# Patient Record
Sex: Female | Born: 1990 | Race: White | Hispanic: No | Smoking: Never smoker
Health system: Southern US, Community
[De-identification: ages and names within clinical notes are randomized; demographics above are authoritative.]

## PROBLEM LIST (undated history)

## (undated) DIAGNOSIS — E282 Polycystic ovarian syndrome: Secondary | ICD-10-CM

## (undated) DIAGNOSIS — L509 Urticaria, unspecified: Secondary | ICD-10-CM

## (undated) DIAGNOSIS — K589 Irritable bowel syndrome without diarrhea: Secondary | ICD-10-CM

## (undated) DIAGNOSIS — E063 Autoimmune thyroiditis: Secondary | ICD-10-CM

## (undated) DIAGNOSIS — R0689 Other abnormalities of breathing: Secondary | ICD-10-CM

## (undated) DIAGNOSIS — K219 Gastro-esophageal reflux disease without esophagitis: Secondary | ICD-10-CM

## (undated) DIAGNOSIS — D693 Immune thrombocytopenic purpura: Secondary | ICD-10-CM

## (undated) DIAGNOSIS — F419 Anxiety disorder, unspecified: Secondary | ICD-10-CM

## (undated) HISTORY — DX: Other abnormalities of breathing: R06.89

## (undated) HISTORY — DX: Irritable bowel syndrome, unspecified: K58.9

## (undated) HISTORY — DX: Gastro-esophageal reflux disease without esophagitis: K21.9

## (undated) HISTORY — PX: TONSILLECTOMY: SUR1361

## (undated) HISTORY — DX: Morbid (severe) obesity due to excess calories: E66.01

## (undated) HISTORY — PX: TONSILLECTOMY AND ADENOIDECTOMY: SHX28

## (undated) HISTORY — DX: Urticaria, unspecified: L50.9

## (undated) HISTORY — PX: ADENOIDECTOMY: SUR15

## (undated) HISTORY — DX: Polycystic ovarian syndrome: E28.2

---
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
## 2011-01-08 IMAGING — CR DG KNEE COMPLETE 4+V*R*
5 series · 5 of 5 positions shown · non-contrast
Comparison: None

CLINICAL DATA: Right knee pain and knot, no recent injury, history
ITP and juvenile rheumatoid arthritis

RIGHT KNEE - COMPLETE 4+ VIEW

[view not recorded (1 of 5)]
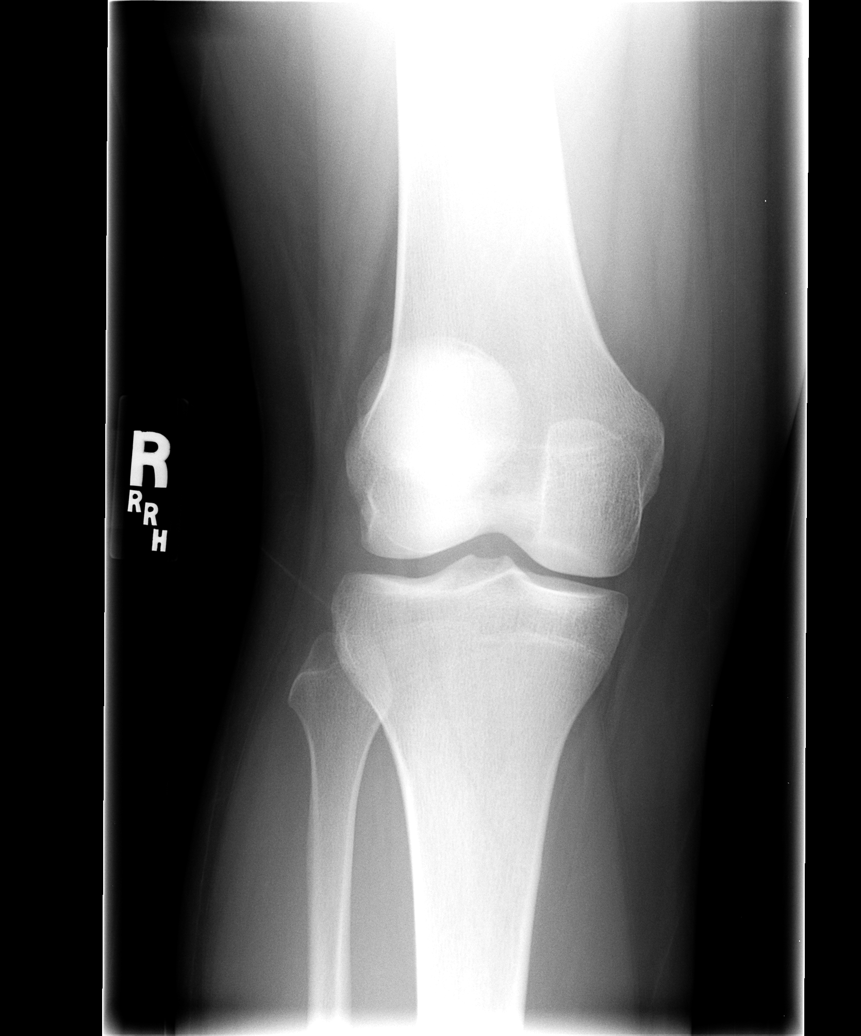

[view not recorded (2 of 5)]
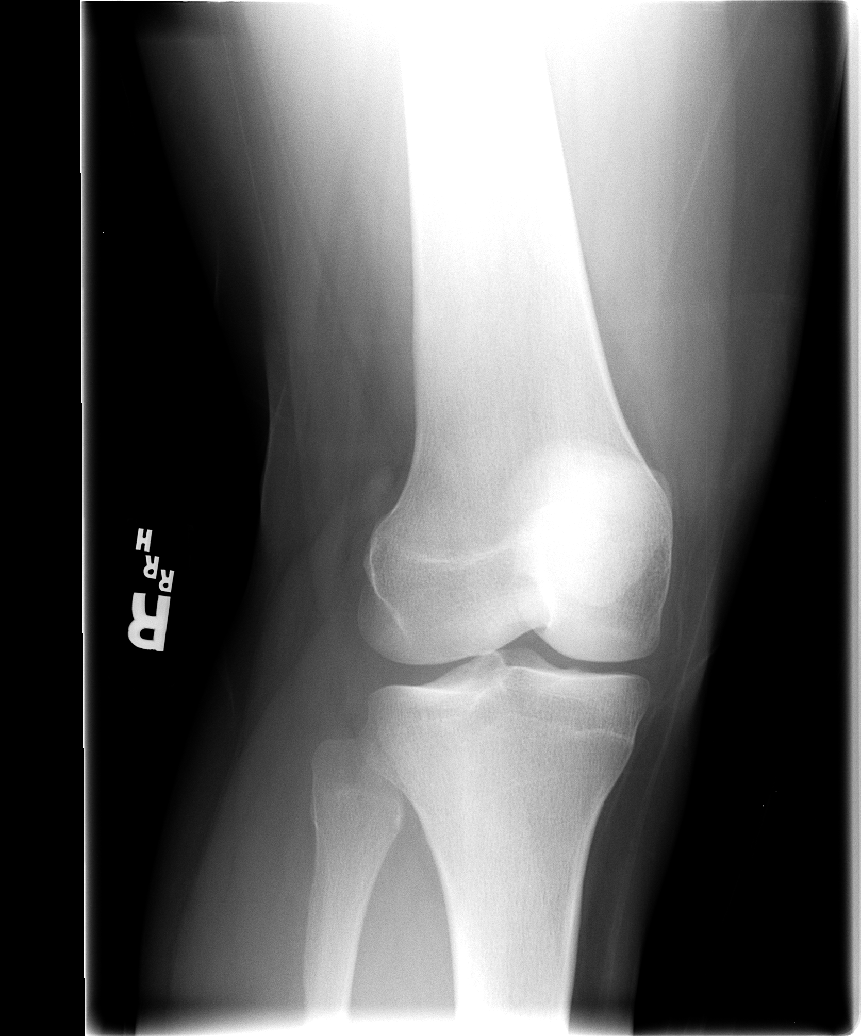

[view not recorded (3 of 5)]
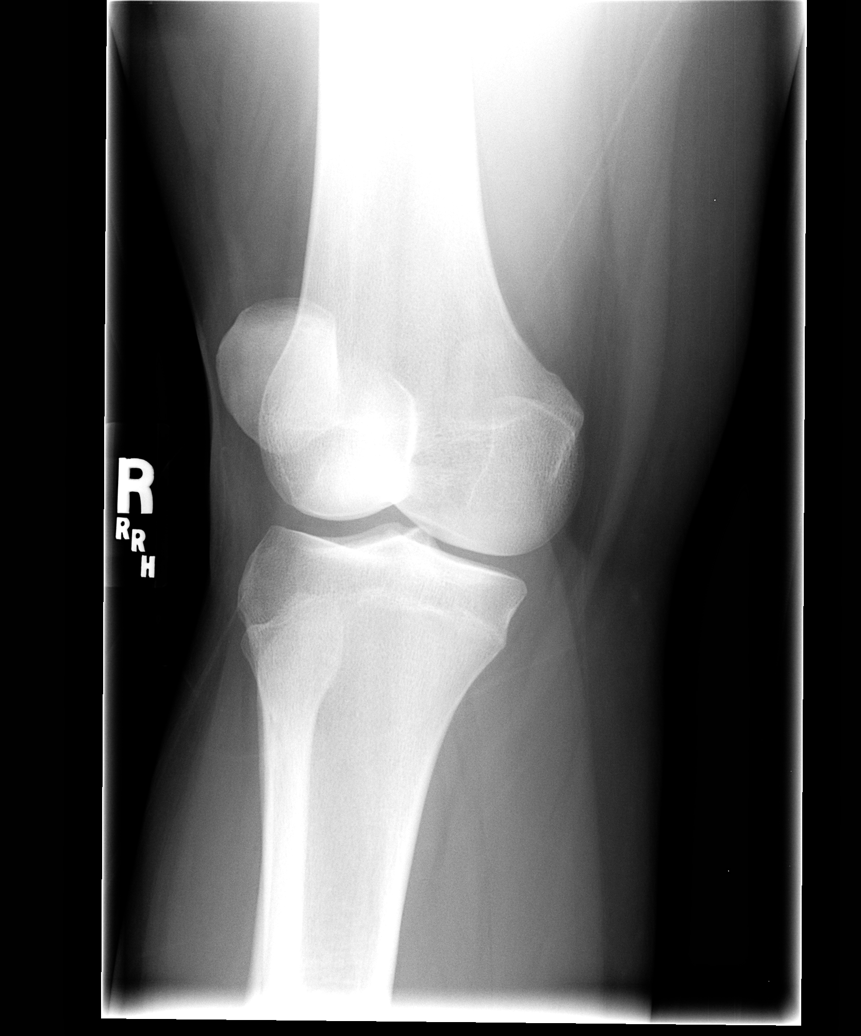

[view not recorded (4 of 5)]
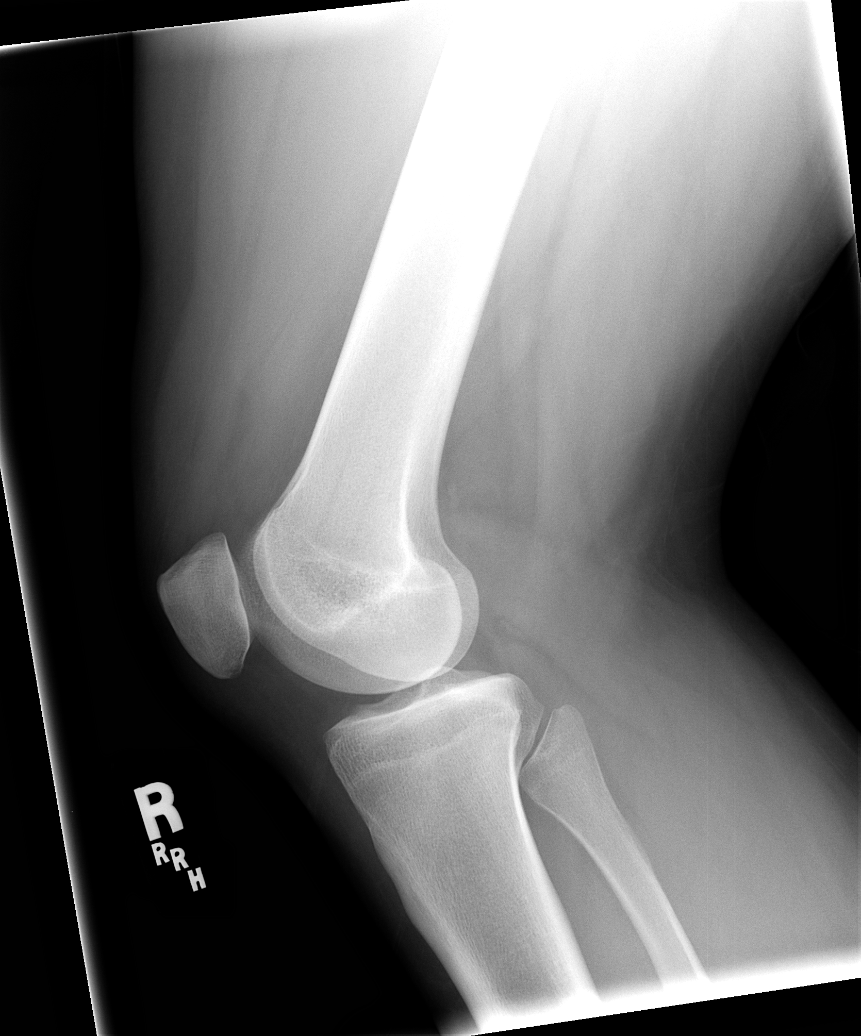

[view not recorded (5 of 5)]
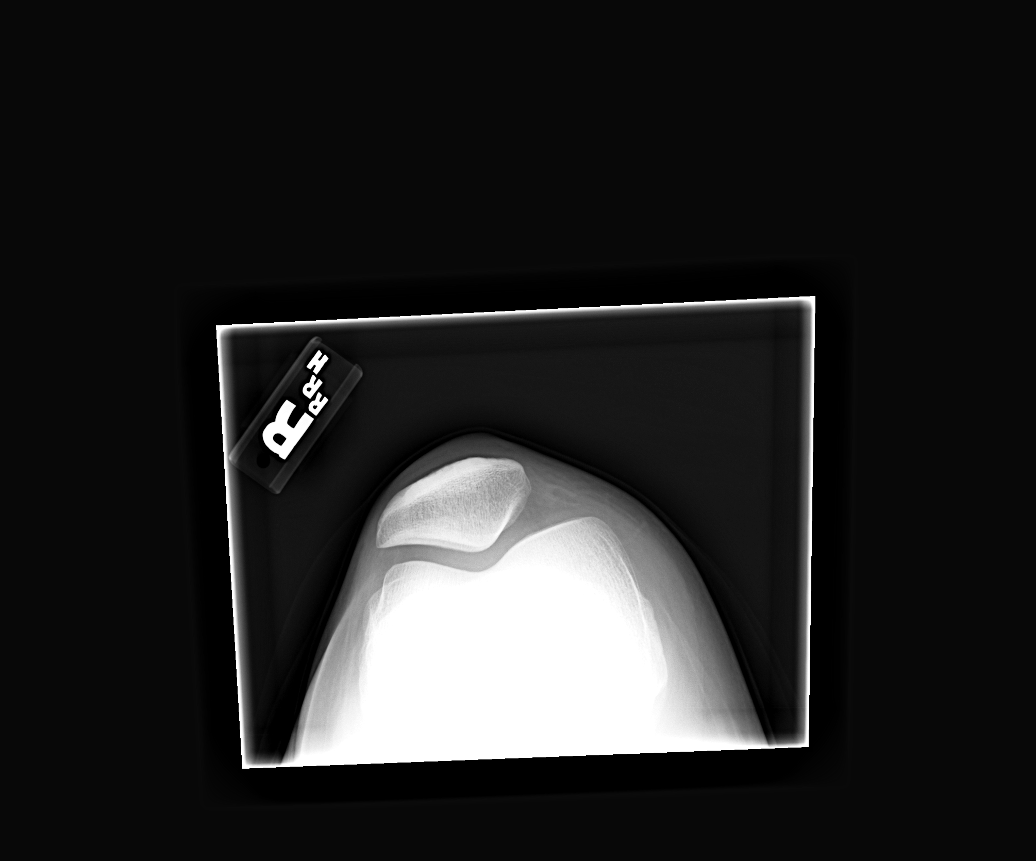

[5 of 5 positions shown; findings below may reference images not displayed]

FINDINGS: Bone mineralization normal.
Joint spaces preserved.
No fracture, dislocation, or bone destruction.
No joint effusion.
No soft tissue calcification or definite mass visualized.
IMPRESSION: No acute bony abnormalities.

## 2011-05-10 ENCOUNTER — Other Ambulatory Visit: Payer: Self-pay | Admitting: Family Medicine

## 2011-05-10 DIAGNOSIS — R1011 Right upper quadrant pain: Secondary | ICD-10-CM

## 2011-05-19 ENCOUNTER — Encounter (INDEPENDENT_AMBULATORY_CARE_PROVIDER_SITE_OTHER): Payer: Self-pay | Admitting: *Deleted

## 2011-05-24 ENCOUNTER — Ambulatory Visit (HOSPITAL_COMMUNITY)
Admission: RE | Admit: 2011-05-24 | Discharge: 2011-05-24 | Disposition: A | Discharge status: Home / Self Care | Payer: 59 | Source: Ambulatory Visit | Attending: Family Medicine | Admitting: Family Medicine

## 2011-05-24 DIAGNOSIS — R1011 Right upper quadrant pain: Secondary | ICD-10-CM

## 2011-05-25 ENCOUNTER — Encounter (INDEPENDENT_AMBULATORY_CARE_PROVIDER_SITE_OTHER): Payer: Self-pay | Admitting: Internal Medicine

## 2011-05-25 ENCOUNTER — Other Ambulatory Visit (INDEPENDENT_AMBULATORY_CARE_PROVIDER_SITE_OTHER): Payer: Self-pay | Admitting: *Deleted

## 2011-05-25 ENCOUNTER — Ambulatory Visit (INDEPENDENT_AMBULATORY_CARE_PROVIDER_SITE_OTHER): Payer: 59 | Admitting: Internal Medicine

## 2011-05-25 ENCOUNTER — Encounter (INDEPENDENT_AMBULATORY_CARE_PROVIDER_SITE_OTHER): Payer: Self-pay | Admitting: *Deleted

## 2011-05-25 DIAGNOSIS — K219 Gastro-esophageal reflux disease without esophagitis: Secondary | ICD-10-CM | POA: Insufficient documentation

## 2011-05-25 DIAGNOSIS — R1013 Epigastric pain: Secondary | ICD-10-CM

## 2011-05-25 NOTE — Patient Instructions (Signed)
Esophagogastroduodenoscopy to be scheduled. Take pantoprazole 40 mg by mouth 30 minutes before breakfast and 30 minutes before evening meal.

## 2011-05-26 ENCOUNTER — Encounter (HOSPITAL_COMMUNITY): Payer: Self-pay | Admitting: *Deleted

## 2011-05-26 ENCOUNTER — Encounter (HOSPITAL_COMMUNITY)
Admission: RE | Disposition: A | Discharge status: Home / Self Care | Payer: Self-pay | Source: Ambulatory Visit | Attending: Internal Medicine

## 2011-05-26 ENCOUNTER — Ambulatory Visit (HOSPITAL_COMMUNITY)
Admission: RE | Admit: 2011-05-26 | Discharge: 2011-05-26 | Disposition: A | Discharge status: Home / Self Care | Payer: 59 | Source: Ambulatory Visit | Attending: Internal Medicine | Admitting: Internal Medicine

## 2011-05-26 DIAGNOSIS — K228 Other specified diseases of esophagus: Secondary | ICD-10-CM

## 2011-05-26 DIAGNOSIS — K219 Gastro-esophageal reflux disease without esophagitis: Secondary | ICD-10-CM

## 2011-05-26 DIAGNOSIS — D131 Benign neoplasm of stomach: Secondary | ICD-10-CM | POA: Insufficient documentation

## 2011-05-26 DIAGNOSIS — R1013 Epigastric pain: Secondary | ICD-10-CM

## 2011-05-26 DIAGNOSIS — K2289 Other specified disease of esophagus: Secondary | ICD-10-CM

## 2011-05-26 HISTORY — PX: ESOPHAGOGASTRODUODENOSCOPY: SHX5428

## 2011-05-26 SURGERY — EGD (ESOPHAGOGASTRODUODENOSCOPY)
Anesthesia: Moderate Sedation

## 2011-05-26 MED ORDER — STERILE WATER FOR IRRIGATION IR SOLN
Status: DC | PRN
  Administered 2011-05-26: 15:00:00

## 2011-05-26 MED ORDER — BUTAMBEN-TETRACAINE-BENZOCAINE 2-2-14 % EX AERO
INHALATION_SPRAY | CUTANEOUS | Status: DC | PRN
  Administered 2011-05-26: 1 via TOPICAL

## 2011-05-26 MED ORDER — MEPERIDINE HCL 25 MG/ML IJ SOLN
INTRAMUSCULAR | Status: DC | PRN
  Administered 2011-05-26 (×2): 25 mg via INTRAVENOUS

## 2011-05-26 MED ORDER — SODIUM CHLORIDE 0.45 % IV SOLN
Freq: Once | INTRAVENOUS | Status: AC
  Administered 2011-05-26: 1000 mL via INTRAVENOUS

## 2011-05-26 MED ORDER — MIDAZOLAM HCL 5 MG/5ML IJ SOLN
INTRAMUSCULAR | Status: DC | PRN
  Administered 2011-05-26 (×2): 2 mg via INTRAVENOUS
  Administered 2011-05-26: 1 mg via INTRAVENOUS
  Administered 2011-05-26: 2 mg via INTRAVENOUS
  Administered 2011-05-26: 1 mg via INTRAVENOUS

## 2011-05-26 MED ORDER — MIDAZOLAM HCL 5 MG/5ML IJ SOLN
INTRAMUSCULAR | Status: AC
  Filled 2011-05-26: qty 10

## 2011-05-26 MED ORDER — MEPERIDINE HCL 50 MG/ML IJ SOLN
INTRAMUSCULAR | Status: AC
  Filled 2011-05-26: qty 1

## 2011-05-26 NOTE — Consult Note (Signed)
NAMEANIVEA, Deborah Douglas                ACCOUNT NO.:  [id number]  MEDICAL RECORD NO.:  [id number]  LOCATION:  XRAY                          FACILITY:  [facility]  PHYSICIAN:  Lionel December, M.D.    DATE OF BIRTH:  06/21/1990  DATE OF CONSULTATION:  05/25/2011 DATE OF DISCHARGE:  05/24/2011                                CONSULTATION   CONSULTING PHYSICIAN:  Scott A. Luking, MD  REASON FOR CONSULTATION:  Intractable heartburn and epigastric pain.  HISTORY OF PRESENT ILLNESS:  Deborah Douglas is a 21 year old Caucasian female who is referred through courtesy of Dr. Lilyan Punt for GI evaluation. She is accompanied by her mother, Ms. Ailis Rigaud.  According to the patient's mother, the patient has had problems with heartburn since she was a child.  Her symptoms got worse when she was in middle school and she was begun on Nexium and early on, she did quite well.  After 2 or 3 years, she was switched to pantoprazole and she seemed to do well until November last year when she noted flare-up of her heartburn regurgitation as well as epigastric pain.  Pantoprazole dose was increased to twice daily and she noted significant relief of her symptoms.  After 1 month, dose was decreased and very quickly her symptoms relapsed and now she is back on PPI twice daily and not doing well.  She feels she has a constant heartburn.  She has nausea, intermittent regurgitation.  She also complains of postprandial epigastric pain which she describes as tightness.  She states she feels hard in epigastric region.  She has also been belching frequently and these belches or burps are painful.  She denies hoarseness, chronic cough, sore throat or dysphagia.  She says her bowel has been irregular, but she denies melena or rectal bleeding.  She has lost 20 pounds in the last 2 months.  She has been watching her diet.  She states it is not helping anymore.  She had lab studies as well as ultrasound which was negative  for cholelithiasis or dilated bile duct.  She does not take any OTC NSAIDs.  CURRENT MEDICATIONS:  Pantoprazole 40 mg p.o. b.i.d. (she takes them at night), phentermine 18.75 mg p.o. q.a.m., topiramate 50 mg p.o. b.i.d., and Ortho Tri-Cyclen Lo p.o. daily.  PAST MEDICAL HISTORY:  She has had symptoms of GERD for several years.  Irritable bowel syndrome.  History of migraines since age 16.  She has polycystic ovaries diagnosed at age 55.  Obesity.  She has had weight problems for the last 3-4 years.  Her peak weight was 230 pounds in December 2012.  She had tonsillectomy and adenoidectomy at age 63.  She had right knee arthroscopy with removal of fibroma at age 43.  ALLERGIES:  To MIDRIN which resulted in generalized numbness.  FAMILY HISTORY:  Both parents have GERD and mother has IBS.  She has 2 sisters in good health.  SOCIAL HISTORY:  She is single.  She is at [employer] major in criminal justice.  She does not smoke cigarettes or drink alcohol.  She states she walks or jogs 3-4 times a week.  PHYSICAL EXAM:  VITAL SIGNS:  Weight  209.7 pounds.  She is 66 inches tall.  Pulse 76 per minute, blood pressure 120/90, respiratory rate is 16 and temp is 98.7. EYES:  Conjunctivae are pink.  Sclerae are nonicteric. MOUTH:  Oropharyngeal mucosa is normal. NECK:  No neck masses or thyromegaly noted. CARDIAC:  With regular rhythm.  Normal S1 and S2.  No murmur or gallop noted. LUNGS:  Clear to auscultation. ABDOMEN:  Full.  Bowel sounds are normal.  On palpation, soft abdomen with mild midepigastric tenderness.  No organomegaly or masses. RECTAL:  Deferred. EXTREMITIES:  No peripheral edema or clubbing noted.  LAB DATA:  From May 10, 2011, WBC 9.9, H and H is 12.3 and 39.5 platelet count 338 K.  Electrolytes normal.  BUN 10, creatinine 0.8, glucose 80, calcium 9.4.  Bilirubin 0.2, AP 51, AST 11, ALT 9, total protein 7.0 with albumin of 4.1 and lipase  23.  DIAGNOSTIC STUDIES:  Ultrasound performed May 24, 2011, reveals no evidence of cholelithiasis or thickened gallbladder wall.  Bile duct, very small measuring 1.6-mm.  No normal texture to liver.  Mild riddle lobe configuration felt to be normal variant.  Part of the pancreas that was seen was normal.  Spleen is 7 cm.  No evidence of urolithiasis or hydronephrosis.  ASSESSMENT:  [name] is a 21 year old Caucasian female, who has had symptoms of gastroesophageal reflux disease  for several years.  She currently is on pantoprazole at 40 mg b.i.d. with poorly-controlled symptoms.  She is having postprandial epigastric pain as well as painful belching or burping.  She has lost 20 pounds over the last couple of months which appears to both voluntary and involuntary.  Helicobacter pylori status is unknown.  Given her presentation, need to rule out peptic ulcer disease, pyloric stenosis.  She may have biliary tract disease, in spite of negative ultrasound, but first we need to rule out peptic ulcer disease.  RECOMMENDATIONS:  The patient advised to take pantoprazole 40 mg p.o. 30 minutes before breakfast and 30 minutes before evening meal.  Esophagogastroduodenoscopy to be scheduled in near future.  Further recommendations will depend on endoscopic findings.  We appreciate the opportunity to participate in the care of this nice lady.          ______________________________ Lionel December, M.D.     NR/MEDQ  D:  05/26/2011  T:  05/26/2011  Job:  [id number]  cc:   Lorin Picket A. Gerda Diss, MD Fax: [phone]

## 2011-05-26 NOTE — H&P (Signed)
This is an update to history and physical from 05/25/2011. There has been no change in patient's history or medications. Patient is undergoing diagnostic EGD.

## 2011-05-26 NOTE — Op Note (Signed)
EGD PROCEDURE REPORT  PATIENT:  Deborah Douglas  MR#:  [id number] Birthdate:  [birth year], 21 y.o., female Endoscopist:  Dr. Malissa Hippo, MD Referred By:  Dr. Lilyan Punt, M.D. Procedure Date: 05/26/2011  Procedure:   EGD  Indications:  Patient is a 21 year old Caucasian female seven-year history of heart and no foreign controlled with therapy. She also complains of epigastric pain. She is at normal CBC, LFTs, serum calcium as well as hepatobiliary ultrasound.            Informed Consent:  Procedure and risks were reviewed with the patient and informed consent was obtained.  Medications:  Demerol 50 mg IV Versed 8 mg IV Cetacaine spray topically for oropharyngeal anesthesia  Description of procedure:  The endoscope was introduced through the mouth and advanced to the second portion of the duodenum without difficulty or limitations. The mucosal surfaces were surveyed very carefully during advancement of the scope and upon withdrawal.  Findings:  Esophagus:  Mucosa of the esophagus was normal. Sawtooth appearance the GE junction. Biopsy taken for routine histology. GEJ:  36 cm Stomach:  Stomach was empty and distended very well with insufflation. Full in the proximal stomach are normal. Examination mucosa revealed multiple polyps in gastric body fundus ranging in size from 4-8 mm. These are felt to be hyperplastic polyps. 4 of these were biopsied for histologic confirmation. Antral mucosa was normal. Pyloric channel was patent. Mucosa at  angularis was also normal. Duodenum:  Normal bulbar and post bulbar mucosa.  Therapeutic/Diagnostic Maneuvers Performed:  See above  Complications:  None  Impression: No evidence of erosive esophagitis. Sawtooth appearance to GE junction. Biopsy taken to rule out short segment Barrett's. Multiple small polyps in gastric body and fundus with typical features of hyperplastic polyps. 4 of these were biopsied for histology.  Recommendations:  Continue  anti-reflux measures and pantoprazole at twice a day schedule. Discontinue phentermine. Be contacting the patient with the results of biopsy.  Theopolis Sloop U  05/26/2011  3:19 PM  CC: Dr. Lilyan Punt, MD, MD & Dr. Bonnetta Barry ref. provider found

## 2011-05-26 NOTE — Discharge Instructions (Signed)
Discontinue phentermine for now; resume other medications as before. No driving for 24 hours. Physician will contact you and biopsy results. Esophagogastroduodenoscopy This is an endoscopic procedure (a procedure that uses a device like a flexible telescope) that allows your caregiver to view the upper stomach and small bowel. This test allows your caregiver to look at the esophagus. The esophagus carries food from your mouth to your stomach. They can also look at your duodenum. This is the first part of the small intestine that attaches to the stomach. This test is used to detect problems in the bowel such as ulcers and inflammation. PREPARATION FOR TEST Nothing to eat after midnight the day before the test. NORMAL FINDINGS Normal esophagus, stomach, and duodenum. Ranges for normal findings may vary among different laboratories and hospitals. You should always check with your doctor after having lab work or other tests done to discuss the meaning of your test results and whether your values are considered within normal limits. MEANING OF TEST  Your caregiver will go over the test results with you and discuss the importance and meaning of your results, as well as treatment options and the need for additional tests if necessary. OBTAINING THE TEST RESULTS It is your responsibility to obtain your test results. Ask the lab or department performing the test when and how you will get your results. Document Released: 07/09/2004 Document Revised: 02/25/2011 Document Reviewed: 02/16/2008 [facility] Patient Information 2012 [place], [place].

## 2011-05-31 ENCOUNTER — Encounter (HOSPITAL_COMMUNITY): Payer: Self-pay | Admitting: Internal Medicine

## 2011-05-31 NOTE — Progress Notes (Signed)
CONSULTING PHYSICIAN: Scott A. Luking, MD  REASON FOR CONSULTATION: Intractable heartburn and epigastric pain.  HISTORY OF PRESENT ILLNESS: Deborah Douglas is a 21 year old Caucasian female  who is referred through courtesy of Dr. Lilyan Punt for GI evaluation.  She is accompanied by her mother, Ms. Zyaire Dumas.  According to the patient's mother, the patient has had problems with  heartburn since she was a child. Her symptoms got worse when she was in  middle school and she was begun on Nexium and early on, she did quite  well. After 2 or 3 years, she was switched to pantoprazole and she  seemed to do well until November last year when she noted flare-up of  her heartburn regurgitation as well as epigastric pain. Pantoprazole  dose was increased to twice daily and she noted significant relief of  her symptoms. After 1 month, dose was decreased and very quickly her  symptoms relapsed and now she is back on PPI twice daily and not doing  well. She feels she has a constant heartburn. She has nausea,  intermittent regurgitation. She also complains of postprandial  epigastric pain which she describes as tightness. She states she feels  hard in epigastric region. She has also been belching frequently and  these belches or burps are painful. She denies hoarseness, chronic  cough, sore throat or dysphagia. She says her bowel has been irregular,  but she denies melena or rectal bleeding. She has lost 20 pounds in the  last 2 months. She has been watching her diet. She states it is not  helping anymore.  She had lab studies as well as ultrasound which was negative for  cholelithiasis or dilated bile duct. She does not take any OTC NSAIDs.  CURRENT MEDICATIONS: Pantoprazole 40 mg p.o. b.i.d. (she takes them at  night), phentermine 18.75 mg p.o. q.a.m., topiramate 50 mg p.o. b.i.d.,  and Ortho Tri-Cyclen Lo p.o. daily.  PAST MEDICAL HISTORY: She has had symptoms of GERD for several years.  Irritable bowel  syndrome.  History of migraines since age 47.  She has polycystic ovaries diagnosed at age 27.  Obesity. She has had weight problems for the last 3-4 years. Her peak  weight was 230 pounds in December 2012.  She had tonsillectomy and adenoidectomy at age 74.  She had right knee arthroscopy with removal of fibroma at age 36.  ALLERGIES: To MIDRIN which resulted in generalized numbness.  FAMILY HISTORY: Both parents have GERD and mother has IBS. She has 2  sisters in good health.  SOCIAL HISTORY: She is single. She is at [employer] major in  criminal justice. She does not smoke cigarettes or drink alcohol. She  states she walks or jogs 3-4 times a week.  PHYSICAL EXAM: VITAL SIGNS: Weight 209.7 pounds. She is 66 inches  tall. Pulse 76 per minute, blood pressure 120/90, respiratory rate is  16 and temp is 98.7.  EYES: Conjunctivae are pink. Sclerae are nonicteric.  MOUTH: Oropharyngeal mucosa is normal.  NECK: No neck masses or thyromegaly noted.  CARDIAC: With regular rhythm. Normal S1 and S2. No murmur or gallop  noted.  LUNGS: Clear to auscultation.  ABDOMEN: Full. Bowel sounds are normal. On palpation, soft abdomen  with mild midepigastric tenderness. No organomegaly or masses.  RECTAL: Deferred.  EXTREMITIES: No peripheral edema or clubbing noted.  LAB DATA: From May 10, 2011, WBC 9.9, H and H is 12.3 and 39.5  platelet count 338 K. Electrolytes normal. BUN 10, creatinine 0.8,  glucose 80, calcium 9.4. Bilirubin 0.2, AP 51, AST 11, ALT 9, total  protein 7.0 with albumin of 4.1 and lipase 23.  DIAGNOSTIC STUDIES: Ultrasound performed May 24, 2011, reveals no  evidence of cholelithiasis or thickened gallbladder wall. Bile duct,  very small measuring 1.6-mm. No normal texture to liver. Mild riddle  lobe configuration felt to be normal variant. Part of the pancreas that  was seen was normal. Spleen is 7 cm. No evidence of urolithiasis or  hydronephrosis.  ASSESSMENT:  [name] is a 21 year old Caucasian female, who has had  symptoms of gastroesophageal reflux disease for several years. She  currently is on pantoprazole at 40 mg b.i.d. with poorly-controlled  symptoms. She is having postprandial epigastric pain as well as painful  belching or burping. She has lost 20 pounds over the last couple of  months which appears to both voluntary and involuntary. Helicobacter  pylori status is unknown. Given her presentation, need to rule out  peptic ulcer disease, pyloric stenosis. She may have biliary tract  disease, in spite of negative ultrasound, but first we need to rule out  peptic ulcer disease.  RECOMMENDATIONS: The patient advised to take pantoprazole 40 mg p.o. 30  minutes before breakfast and 30 minutes before evening meal.  Esophagogastroduodenoscopy to be scheduled in near future.  Further recommendations will depend on endoscopic findings.  We appreciate the opportunity to participate in the care of this nice  lady.

## 2011-06-02 ENCOUNTER — Encounter (INDEPENDENT_AMBULATORY_CARE_PROVIDER_SITE_OTHER): Payer: Self-pay | Admitting: *Deleted

## 2011-06-10 ENCOUNTER — Telehealth (INDEPENDENT_AMBULATORY_CARE_PROVIDER_SITE_OTHER): Payer: Self-pay | Admitting: *Deleted

## 2011-06-10 NOTE — Telephone Encounter (Signed)
Still having discomfort, pain and indigestion/belching when eating. When she is not eating some better, not the constant pain like before. Patient's return phone number is [phone]. Will be out of school the beginning of May.

## 2011-06-15 NOTE — Telephone Encounter (Signed)
I talked with  Patient; she is gradually getting better but not 100%. No new symptoms. Advised to continue pantoprazole at twice a day schedule and call the office with another progress report in 2 weeks

## 2011-06-16 ENCOUNTER — Encounter (INDEPENDENT_AMBULATORY_CARE_PROVIDER_SITE_OTHER): Payer: Self-pay

## 2011-11-05 ENCOUNTER — Other Ambulatory Visit: Payer: Self-pay | Admitting: Family Medicine

## 2011-11-05 DIAGNOSIS — R109 Unspecified abdominal pain: Secondary | ICD-10-CM

## 2011-11-16 ENCOUNTER — Encounter (HOSPITAL_COMMUNITY)
Admission: RE | Admit: 2011-11-16 | Discharge: 2011-11-16 | Disposition: A | Discharge status: Home / Self Care | Payer: 59 | Source: Ambulatory Visit | Attending: Family Medicine | Admitting: Family Medicine

## 2011-11-16 ENCOUNTER — Encounter (HOSPITAL_COMMUNITY): Payer: Self-pay

## 2011-11-16 DIAGNOSIS — R109 Unspecified abdominal pain: Secondary | ICD-10-CM | POA: Insufficient documentation

## 2011-11-16 DIAGNOSIS — R932 Abnormal findings on diagnostic imaging of liver and biliary tract: Secondary | ICD-10-CM | POA: Insufficient documentation

## 2011-11-16 MED ORDER — TECHNETIUM TC 99M MEBROFENIN IV KIT
5.0000 | PACK | Freq: Once | INTRAVENOUS | Status: AC | PRN
  Administered 2011-11-16: 5.2 via INTRAVENOUS

## 2011-12-28 ENCOUNTER — Ambulatory Visit (INDEPENDENT_AMBULATORY_CARE_PROVIDER_SITE_OTHER): Payer: 59 | Admitting: Internal Medicine

## 2011-12-28 ENCOUNTER — Encounter (INDEPENDENT_AMBULATORY_CARE_PROVIDER_SITE_OTHER): Payer: Self-pay | Admitting: Internal Medicine

## 2011-12-28 VITALS — BP 120/70 | HR 74 | Temp 97.4°F | Resp 18 | Ht 67.0 in | Wt 211.3 lb

## 2011-12-28 DIAGNOSIS — R1011 Right upper quadrant pain: Secondary | ICD-10-CM

## 2011-12-28 DIAGNOSIS — D649 Anemia, unspecified: Secondary | ICD-10-CM

## 2011-12-28 DIAGNOSIS — K219 Gastro-esophageal reflux disease without esophagitis: Secondary | ICD-10-CM

## 2011-12-28 LAB — CBC
HCT: 37.2 % (ref 36.0–46.0)
MCH: 25.2 pg — ABNORMAL LOW (ref 26.0–34.0)
MCV: 78.2 fL (ref 78.0–100.0)
RBC: 4.76 MIL/uL (ref 3.87–5.11)
WBC: 10 10*3/uL (ref 4.0–10.5)

## 2011-12-28 LAB — IRON AND TIBC
TIBC: 394 ug/dL (ref 250–470)
UIBC: 357 ug/dL (ref 125–400)

## 2011-12-28 MED ORDER — DICYCLOMINE HCL 10 MG PO CAPS: 10.0000 mg | ORAL_CAPSULE | Freq: Three times a day (TID) | ORAL | Status: DC

## 2011-12-28 NOTE — Progress Notes (Signed)
Presenting complaint;  Right upper quadrant pain and nausea. Abnormal HIDA scan. Subjective:  Deborah Douglas is 21 year old Caucasian female who is in for scheduled visit accompanied by her aunt Deborah Douglas. She has chronic GERD and underwent EGD on 05/26/2011. She has had symptoms since she was in middle school. EGD revealed serrated GE junction and biopsy was negative for Barrett's and reveals changes of reflux esophagitis. She also had gastric polyps which on biopsy for fundic gland polyps. She has changed her eating habits. She is also trying to control her weight. She feels her heartburn is well controlled with double dose PPI as long as she is watching her diet.  Now she presents with complaints of right upper quadrant pain which she said off and on for several months. This pain does not occur every day and not always associated with meals. She describes this as a jabbing pain in may last for up to 30 minutes. At times she feels nauseated. This pain may radiate into her back. At times she scratches and pain gets better at other times pain is alleviated when she stands up. Few occasions she's not been able to stretch her self. She had ultrasound of upper abdomen in February 2013 and was negative for cholelithiasis. She had a HIDA scan on 11/16/2011 revealing EF of 14% but symptoms are not reproduced. Patient also complains of craving for beef and is concerned that she may have iron deficiency. She has been on PPI since November 2012.   Current Medications: Pantoprazole 40 mg by mouth twice a day. B complex one by mouth daily. MVI 1 by mouth daily. Topiramate 50 mg by mouth daily. Ortho Tri-cyclen LO by mouth daily    Objective: Blood pressure 120/70, pulse 74, temperature 97.4 F (36.3 C), temperature source Oral, resp. rate 18, height 5\' 7"  (1.702 m), weight 211 lb 4.8 oz (95.845 kg), last menstrual period 12/10/2011. Conjunctiva is pink. Sclera is nonicteric Oropharyngeal mucosa is normal. No neck  masses or thyromegaly noted. Cardiac exam with regular rhythm normal S1 and S2. No murmur or gallop noted. Lungs are clear to auscultation. Abdomen. Bowel sounds are normal. Abdomen is soft with mild midepigastric tenderness. No organomegaly or masses noted to  No LE edema or clubbing noted.  Labs/studies Results: Ultrasound February 2013 negative for cholelithiasis. HIDA scan 11/16/2011 revealed EF of 14% but symptoms not reproduced.   Assessment:  #1. Intermittent right upper quadrant pain of several months duration. Ultrasound negative for cholelithiasis. HIDA scan abnormal with low EF of 14% but symptoms are not reproduced. Pain is not typical of. Dyskinesia. Therefore recommend observation route and then proceeding with cholecystectomy. #2.GERD. Symptoms well controlled with double dose PPI. If she is successful in losing more weight does could be dropped to once a day in future. #3. History of mild anemia. Now with craving for beef(?pica). She may have iron malabsorption due to  chronic PPI therapy.   Plan:  Anti-reflux measures reinforced. Continue pantoprazole 40 mg by mouth twice a day. Symptom diary as to frequency of right upper quadrant pain and associated symptoms. Dicyclomine 10 mg by mouth before breakfast and lunch. CBC, serum iron, TIBC and ferritin level. Office visit in 3 months.

## 2011-12-28 NOTE — Patient Instructions (Signed)
Physician will contact you with results of blood work. Take dicyclomine 30 mg by mouth before breakfast and lunch. Keep symptom diary for the next 3 months.

## 2011-12-30 ENCOUNTER — Telehealth (INDEPENDENT_AMBULATORY_CARE_PROVIDER_SITE_OTHER): Payer: Self-pay | Admitting: *Deleted

## 2011-12-30 DIAGNOSIS — E611 Iron deficiency: Secondary | ICD-10-CM

## 2011-12-30 DIAGNOSIS — R790 Abnormal level of blood mineral: Secondary | ICD-10-CM

## 2011-12-30 NOTE — Telephone Encounter (Signed)
Per Dr.Rehman the patient will need to have repeat Iron Studies in 6 months. These are noted for April. Patient was called and made aware.

## 2011-12-30 NOTE — Telephone Encounter (Signed)
These labs are part of Iron Studies

## 2012-03-20 ENCOUNTER — Encounter (INDEPENDENT_AMBULATORY_CARE_PROVIDER_SITE_OTHER): Payer: Self-pay | Admitting: Internal Medicine

## 2012-03-20 ENCOUNTER — Ambulatory Visit (INDEPENDENT_AMBULATORY_CARE_PROVIDER_SITE_OTHER): Payer: 59 | Admitting: Internal Medicine

## 2012-03-20 VITALS — BP 120/72 | HR 70 | Temp 97.7°F | Resp 18 | Ht 67.0 in | Wt 211.9 lb

## 2012-03-20 DIAGNOSIS — R1011 Right upper quadrant pain: Secondary | ICD-10-CM

## 2012-03-20 DIAGNOSIS — K219 Gastro-esophageal reflux disease without esophagitis: Secondary | ICD-10-CM

## 2012-03-20 DIAGNOSIS — D509 Iron deficiency anemia, unspecified: Secondary | ICD-10-CM

## 2012-03-20 NOTE — Progress Notes (Signed)
Presenting complaint;  Right upper quadrant pain and GERD.  Subjective:  Patient is 21 year old Caucasian female who is here for scheduled visit accompanied by her mother. She was last seen on 12/28/2011. Her H&H was normal but serum iron saturation and ferritin levels were low. She was begun on ferrous sulfate. She had low ejection fraction on HIDA scan however her symptoms were felt not to be typical. She now presents with two-week history of right upper quadrant pain radiating up into right side of her chest. She also has noted nausea worsening every day. She is taking Zofran 3-4 times a week. She has noted worsening pain with ice cream and rich foods. She describes her pain to be stabbing and aching pain. She has not experienced fever. She was seen by Ms. Sherie Don PA her symptoms were felt to be biliary in origin. Her bowels move regularly. She denies melena or rectal bleeding. Her weight has been stable. She continues to have heartburn at least once a week.  Current Medications: Current Outpatient Prescriptions  Medication Sig Dispense Refill  . B Complex Vitamins (VITAMIN B COMPLEX PO) Take by mouth daily.      Marland Kitchen dicyclomine (BENTYL) 10 MG capsule Take 1 capsule (10 mg total) by mouth 4 (four) times daily -  before meals and at bedtime.  90 capsule  0  . ferrous sulfate 325 (65 FE) MG tablet Take 325 mg by mouth daily with breakfast.      . MULTIPLE VITAMIN PO Take by mouth daily.      . Norgestimate-Ethinyl Estradiol Triphasic (ORTHO TRI-CYCLEN LO) 0.18/0.215/0.25 MG-25 MCG tablet Take 1 tablet by mouth daily.      . ondansetron (ZOFRAN) 8 MG tablet Take by mouth as needed.      . pantoprazole (PROTONIX) 40 MG tablet Take 40 mg by mouth 2 (two) times daily.      Marland Kitchen topiramate (TOPAMAX) 50 MG tablet Take 50 mg by mouth daily.          Objective: Blood pressure 120/72, pulse 70, temperature 97.7 F (36.5 C), temperature source Oral, resp. rate 18, height 5\' 7"  (1.702 m),  weight 211 lb 14.4 oz (96.117 kg), last menstrual period 02/25/2012. Patient is alert and in no acute distress. Conjunctiva is pink. Sclera is nonicteric Oropharyngeal mucosa is normal. No neck masses or thyromegaly noted. Cardiac exam with regular rhythm normal S1 and S2. No murmur or gallop noted. Lungs are clear to auscultation. Abdomen is full. Abdomen is soft with mild tenderness below right costal margin;no organomegaly or masses noted  No LE edema or clubbing noted.  Labs/studies Results: Lab data from 12/28/2011. H&H 12 and 37.2 MCV 78.2. Serum iron 37, TIBC 394 and saturation 9% Serum ferritin was 6.    Assessment:  #1. Acute on chronic right upper quadrant pain. Ultrasound in February 2013 was negative for cholelithiasis. EGD in March 2013 revealed reflux esophagitis and hyperplastic gastric polyps. HIDA scan in August 2013 revealed low EF of 14% but symptoms were not reproduced. Symptoms now suggest biliary dyskinesia. #2. Chronic GERD. Symptoms are reasonably well controlled but she is requiring double dose PPI. #3. Iron deficiency with a normal H&H possibly due to impaired iron absorption secondary to chronic PPI therapy.   Plan:  Consultation with Dr. Malvin Johns for possible cholecystectomy. She will continue ferrous sulfate 325 mg by mouth daily. Continue pantoprazole at 40 mg by mouth twice a day.  CBC iron studies B12 and folate levels in 2012/07/05. Office visit  in 6 months.

## 2012-03-20 NOTE — Patient Instructions (Signed)
Office visit with Dr. Malvin Johns this morning.

## 2012-03-22 HISTORY — PX: CHOLECYSTECTOMY: SHX55

## 2012-05-22 ENCOUNTER — Inpatient Hospital Stay (HOSPITAL_COMMUNITY): Admission: RE | Admit: 2012-05-22 | Payer: 59 | Source: Ambulatory Visit

## 2012-05-23 ENCOUNTER — Ambulatory Visit (HOSPITAL_COMMUNITY): Admission: RE | Admit: 2012-05-23 | Payer: 59 | Source: Ambulatory Visit | Admitting: General Surgery

## 2012-05-23 ENCOUNTER — Encounter (HOSPITAL_COMMUNITY): Admission: RE | Payer: Self-pay | Source: Ambulatory Visit

## 2012-05-23 SURGERY — LAPAROSCOPIC CHOLECYSTECTOMY
Anesthesia: General

## 2012-06-15 ENCOUNTER — Other Ambulatory Visit (INDEPENDENT_AMBULATORY_CARE_PROVIDER_SITE_OTHER): Payer: Self-pay | Admitting: *Deleted

## 2012-06-15 ENCOUNTER — Encounter (INDEPENDENT_AMBULATORY_CARE_PROVIDER_SITE_OTHER): Payer: Self-pay | Admitting: *Deleted

## 2012-06-15 DIAGNOSIS — R790 Abnormal level of blood mineral: Secondary | ICD-10-CM

## 2012-06-15 DIAGNOSIS — E611 Iron deficiency: Secondary | ICD-10-CM

## 2012-06-19 ENCOUNTER — Other Ambulatory Visit: Payer: Self-pay | Admitting: *Deleted

## 2012-06-19 MED ORDER — PANTOPRAZOLE SODIUM 40 MG PO TBEC: 40.0000 mg | DELAYED_RELEASE_TABLET | Freq: Two times a day (BID) | ORAL | Status: DC

## 2012-08-22 ENCOUNTER — Other Ambulatory Visit: Payer: Self-pay | Admitting: *Deleted

## 2012-08-22 MED ORDER — NORGESTIM-ETH ESTRAD TRIPHASIC 0.18/0.215/0.25 MG-25 MCG PO TABS: 1.0000 | ORAL_TABLET | Freq: Every day | ORAL | Status: DC

## 2012-08-24 ENCOUNTER — Other Ambulatory Visit: Payer: Self-pay | Admitting: Nurse Practitioner

## 2012-08-24 ENCOUNTER — Telehealth: Payer: Self-pay | Admitting: Family Medicine

## 2012-08-24 MED ORDER — NORGESTIM-ETH ESTRAD TRIPHASIC 0.18/0.215/0.25 MG-25 MCG PO TABS: 1.0000 | ORAL_TABLET | Freq: Every day | ORAL | Status: DC

## 2012-08-24 NOTE — Telephone Encounter (Signed)
Is patient on regular TriSprintec or Lo?  Scott sent in Rx for Lo dose which I refilled.  Let me know if I need to change this.  Thanks.

## 2012-08-24 NOTE — Telephone Encounter (Signed)
Patient says that she is in college and wont be able to make an appointment to followup for birth control until August and would like to know if you would add more refills to her Tri-sprintec until she may get in.

## 2012-08-25 ENCOUNTER — Telehealth: Payer: Self-pay

## 2012-08-25 NOTE — Telephone Encounter (Signed)
Patient is taking the regular TriSprintec not the Lo.

## 2012-08-25 NOTE — Telephone Encounter (Signed)
That is what is in my notes as well.  She should be on regular.  Thanks for calling this in.

## 2012-08-25 NOTE — Telephone Encounter (Signed)
Pharmacy called and stated that RX was written for ortho-tricyclen Lo and patient has always taken the regular. Gave pharmacy verbal to fill the regular ortho-tricyclen. Patient states that she takes the regular.

## 2012-09-13 ENCOUNTER — Encounter: Payer: Self-pay | Admitting: *Deleted

## 2012-09-13 ENCOUNTER — Other Ambulatory Visit: Payer: Self-pay | Admitting: *Deleted

## 2012-09-13 MED ORDER — PANTOPRAZOLE SODIUM 40 MG PO TBEC: 40.0000 mg | DELAYED_RELEASE_TABLET | Freq: Two times a day (BID) | ORAL | Status: DC

## 2012-09-18 ENCOUNTER — Ambulatory Visit (INDEPENDENT_AMBULATORY_CARE_PROVIDER_SITE_OTHER): Payer: 59 | Admitting: Internal Medicine

## 2012-10-11 ENCOUNTER — Telehealth: Payer: Self-pay | Admitting: Family Medicine

## 2012-10-11 NOTE — Telephone Encounter (Signed)
Patient would like to know if you recommend an orthopedic doctor and gynecologist in the [place] area?

## 2012-10-16 NOTE — Telephone Encounter (Signed)
Pt calling to check on this request, advised we would get back with her as soon as we can.

## 2012-10-19 NOTE — Telephone Encounter (Signed)
Has this been completed?

## 2012-10-30 ENCOUNTER — Ambulatory Visit (INDEPENDENT_AMBULATORY_CARE_PROVIDER_SITE_OTHER): Payer: 59 | Admitting: Internal Medicine

## 2012-10-30 ENCOUNTER — Encounter (INDEPENDENT_AMBULATORY_CARE_PROVIDER_SITE_OTHER): Payer: Self-pay | Admitting: Internal Medicine

## 2012-10-30 ENCOUNTER — Encounter: Payer: Self-pay | Admitting: Nurse Practitioner

## 2012-10-30 VITALS — BP 128/74 | HR 72 | Temp 99.5°F | Resp 18 | Ht 67.0 in | Wt 218.3 lb

## 2012-10-30 DIAGNOSIS — D509 Iron deficiency anemia, unspecified: Secondary | ICD-10-CM

## 2012-10-30 DIAGNOSIS — R1011 Right upper quadrant pain: Secondary | ICD-10-CM

## 2012-10-30 DIAGNOSIS — K219 Gastro-esophageal reflux disease without esophagitis: Secondary | ICD-10-CM

## 2012-10-30 LAB — IRON AND TIBC
Iron: 53 ug/dL (ref 42–145)
TIBC: 360 ug/dL (ref 250–470)
UIBC: 307 ug/dL (ref 125–400)

## 2012-10-30 NOTE — Progress Notes (Addendum)
Presenting complaint;  Follow for GERD and right upper quadrant pain. History of iron deficiency(normal H/H)  Subjective:  Patient is 22 year old Caucasian female who presents for scheduled visit. She was last seen December 2013. Following that visit she was referred to Dr. Malvin Johns recurrent right upper quadrant abdominal pain and abnormal HIDA scan with EF of 14%. He recommended observation. Patient states she is changing her eating habits. She feels better. She may have right upper quadrant pain every couple of weeks. It usually occurs after meals. She is still having sporadic nausea. Last episode was yesterday pretty with ondansetron. She says her heartburn is well controlled as long as she takes pantoprazole twice daily. If she skips the morning dose she gets sick and if she misses evening dose she wakes up with heartburn at night. She denies dysphagia hoarseness chronic cough or sore throat. She has gained 7 pounds since her last visit. She denies melena or rectal bleeding. Her periods are regular and never heavy. She did not have blood work in April as planned.  Current Medications: Current Outpatient Prescriptions  Medication Sig Dispense Refill  . B Complex Vitamins (VITAMIN B COMPLEX PO) Take by mouth daily.      . ferrous sulfate 325 (65 FE) MG tablet Take 325 mg by mouth daily with breakfast.      . MULTIPLE VITAMIN PO Take by mouth daily.      . Norgestimate-Ethinyl Estradiol Triphasic (ORTHO TRI-CYCLEN LO) 0.18/0.215/0.25 MG-25 MCG tab Take 1 tablet by mouth daily.  1 Package  2  . ondansetron (ZOFRAN) 8 MG tablet Take by mouth every 8 (eight) hours as needed.       . pantoprazole (PROTONIX) 40 MG tablet Take 1 tablet (40 mg total) by mouth 2 (two) times daily.  60 tablet  2  . dicyclomine (BENTYL) 10 MG capsule Take 10 mg by mouth daily before breakfast.       No current facility-administered medications for this visit.     Objective: Blood pressure 128/74, pulse 72,  temperature 99.5 F (37.5 C), temperature source Oral, resp. rate 18, height 5\' 7"  (1.702 m), weight 218 lb 4.8 oz (99.02 kg), last menstrual period 10/19/2012. Patient is alert and in no acute distress Conjunctiva is pink. Sclera is nonicteric Oropharyngeal mucosa is normal. No neck masses or thyromegaly noted. Cardiac exam with regular rhythm normal S1 and S2. No murmur or gallop noted. Lungs are clear to auscultation. Abdomen is soft with mild tenderness below right costal margin. No organomegaly or masses. No LE edema or clubbing noted.    Assessment: #1. RUQ abdominal. Workup negative for cholelithiasis..  She had low ejection fraction on HIDA scan but symptoms not reproduced. She was evaluated by Dr. Malvin Johns who recommended monitoring her symptoms. #2. Chronic GERD. Symptoms are reasonably well controlled but she is requiring double dose PPI. She had EGD and March 2013 biopsy was negative for Barrett's. She has had symptoms of GERD since childhood. #3. Iron deficiency with normal H&H. She is on ferrous sulfate daily. She may have impaired iron adoption due to chronic PPI therapy.   Plan:  She will continue pantoprazole at 40 mg by mouth twice a day. Patient will go to lab for serum iron, TIBC, serum ferritin and B12 level. Office visit in one year.

## 2012-10-30 NOTE — Patient Instructions (Signed)
Physician will contact you with results of blood work. 

## 2012-10-31 ENCOUNTER — Encounter: Payer: Self-pay | Admitting: Nurse Practitioner

## 2012-10-31 ENCOUNTER — Ambulatory Visit (INDEPENDENT_AMBULATORY_CARE_PROVIDER_SITE_OTHER): Payer: 59 | Admitting: Nurse Practitioner

## 2012-10-31 VITALS — BP 126/76 | HR 90 | Ht 65.75 in | Wt 220.8 lb

## 2012-10-31 DIAGNOSIS — Z0000 Encounter for general adult medical examination without abnormal findings: Secondary | ICD-10-CM

## 2012-10-31 DIAGNOSIS — K219 Gastro-esophageal reflux disease without esophagitis: Secondary | ICD-10-CM

## 2012-10-31 LAB — LIPID PANEL
Cholesterol: 154 mg/dL (ref 0–200)
HDL: 50 mg/dL (ref >39)
Total CHOL/HDL Ratio: 3.1 Ratio
Triglycerides: 153 mg/dL — ABNORMAL HIGH (ref <150)
VLDL: 31 mg/dL (ref 0–40)

## 2012-10-31 LAB — BASIC METABOLIC PANEL
BUN: 9 mg/dL (ref 6–23)
Calcium: 9 mg/dL (ref 8.4–10.5)
Creat: 0.67 mg/dL (ref 0.50–1.10)
Glucose, Bld: 76 mg/dL (ref 70–99)
Potassium: 4.3 mEq/L (ref 3.5–5.3)

## 2012-10-31 LAB — HEPATIC FUNCTION PANEL
Albumin: 3.9 g/dL (ref 3.5–5.2)
Bilirubin, Direct: <0.1 mg/dL (ref 0.0–0.3)
Total Bilirubin: 0.3 mg/dL (ref 0.3–1.2)

## 2012-10-31 MED ORDER — NORGESTIM-ETH ESTRAD TRIPHASIC 0.18/0.215/0.25 MG-25 MCG PO TABS: 1.0000 | ORAL_TABLET | Freq: Every day | ORAL | Status: DC

## 2012-10-31 MED ORDER — PANTOPRAZOLE SODIUM 40 MG PO TBEC: 40.0000 mg | DELAYED_RELEASE_TABLET | Freq: Two times a day (BID) | ORAL | Status: DC

## 2012-10-31 MED ORDER — LORCASERIN HCL 10 MG PO TABS: 10.0000 mg | ORAL_TABLET | Freq: Two times a day (BID) | ORAL | Status: DC

## 2012-10-31 MED ORDER — NORGESTIM-ETH ESTRAD TRIPHASIC 0.18/0.215/0.25 MG-35 MCG PO TABS: 1.0000 | ORAL_TABLET | Freq: Every day | ORAL | Status: DC

## 2012-10-31 NOTE — Telephone Encounter (Signed)
Discussed with patient at ov today.  Has found GYN.  Still looking for ortho.

## 2012-10-31 NOTE — Assessment & Plan Note (Addendum)
Continue Protonix 40 mg twice a day. Continue regular followup with GI specialist.

## 2012-10-31 NOTE — Progress Notes (Signed)
RX called into pharmacy on voicemail. 

## 2012-10-31 NOTE — Progress Notes (Signed)
RX called into pharmacy on voicemail.

## 2012-10-31 NOTE — Progress Notes (Signed)
Subjective:    Patient ID: Deborah Douglas, female    DOB: 28-Nov-1990, 22 y.o.   MRN: [id number]  HPI presents wellness checkup. Regular cycles, normal flow lasting 3-4 days. Has completed HPV vaccine. Denies any history of sexual activity. Regular vision and dental exams. Reflux stable on Protonix. Continues regular followup with her GI specialist. Staying very active. Trying to work with her diet. Some ear pressure. No fever.    Review of Systems  Constitutional: Negative for fever, activity change, appetite change and fatigue.  HENT: Positive for rhinorrhea. Negative for hearing loss, congestion, sore throat and dental problem.   Eyes: Negative for visual disturbance.  Respiratory: Negative for cough, chest tightness, shortness of breath and wheezing.   Cardiovascular: Negative for chest pain, palpitations and leg swelling.  Gastrointestinal: Positive for nausea and constipation. Negative for vomiting, abdominal pain, diarrhea, blood in stool and anal bleeding.  Genitourinary: Negative for dysuria, urgency, frequency, vaginal discharge, difficulty urinating, genital sores, menstrual problem and pelvic pain.  Neurological: Negative for headaches.  Psychiatric/Behavioral: Negative for sleep disturbance and dysphoric mood. The patient is not nervous/anxious.        Objective:   Physical Exam  Vitals reviewed. Constitutional: She is oriented to person, place, and time. She appears well-developed. No distress.  HENT:  Right Ear: External ear normal.  Left Ear: External ear normal.  Mouth/Throat: Oropharynx is clear and moist.  Neck: Normal range of motion. Neck supple. No tracheal deviation present. No thyromegaly present.  Cardiovascular: Normal rate, regular rhythm and normal heart sounds.  Exam reveals no gallop.   No murmur heard. Pulmonary/Chest: Effort normal and breath sounds normal.  Abdominal: Soft. She exhibits no distension. There is no tenderness.  Musculoskeletal: She  exhibits no edema.  Lymphadenopathy:    She has no cervical adenopathy.  Neurological: She is alert and oriented to person, place, and time.  Skin: Skin is warm and dry. No rash noted.  Psychiatric: She has a normal mood and affect. Her behavior is normal.   Breast exam: No masses noted. Axilla no adenopathy. Defers GU exam, denies any problems. TMs mild clear effusion, no erythema.        Assessment & Plan:  Well woman exam (no gynecological exam)  Routine general medical examination at a health care facility - Plan: Basic metabolic panel, Hepatic function panel, Lipid panel, TSH, Vitamin D 25 hydroxy, Basic metabolic panel, Hepatic function panel, Lipid panel, TSH, Vitamin D 25 hydroxy  Morbid obesity   Reviewed importance of regular activity and healthy diet. Patient's been having some knee pain over the past month, plans to see if local orthopedic doctor about this. Given information and prescription for Belviq. If she starts medication recommend followup in 3 months. Next physical in one year. Meds ordered this encounter  Medications  . pantoprazole (PROTONIX) 40 MG tablet    Sig: Take 1 tablet (40 mg total) by mouth 2 (two) times daily.    Dispense:  60 tablet    Refill:  5    Order Specific Question:  Supervising Provider    Answer:  Merlyn Albert [[id number]]  . DISCONTD: Norgestimate-Ethinyl Estradiol Triphasic (ORTHO TRI-CYCLEN LO) 0.18/0.215/0.25 MG-25 MCG tab    Sig: Take 1 tablet by mouth daily.    Dispense:  1 Package    Refill:  11    Order Specific Question:  Supervising Provider    Answer:  Merlyn Albert [[id number]]  . Lorcaserin HCl (BELVIQ) 10 MG TABS  Sig: Take 10 mg by mouth 2 (two) times daily.    Dispense:  30 tablet    Refill:  0    Order Specific Question:  Supervising Provider    Answer:  Merlyn Albert [[id number]]  . Lorcaserin HCl (BELVIQ) 10 MG TABS    Sig: Take 10 mg by mouth 2 (two) times daily.    Dispense:  60 tablet    Refill:  2    Order  Specific Question:  Supervising Provider    Answer:  Merlyn Albert [[id number]]  . Norgestimate-Ethinyl Estradiol Triphasic 0.18/0.215/0.25 MG-35 MCG tablet    Sig: Take 1 tablet by mouth daily.    Dispense:  1 Package    Refill:  11    Order Specific Question:  Supervising Provider    Answer:  Riccardo Dubin

## 2012-11-02 ENCOUNTER — Encounter: Payer: Self-pay | Admitting: Nurse Practitioner

## 2012-11-06 ENCOUNTER — Ambulatory Visit (INDEPENDENT_AMBULATORY_CARE_PROVIDER_SITE_OTHER): Payer: 59 | Admitting: Internal Medicine

## 2012-11-07 ENCOUNTER — Encounter: Payer: Self-pay | Admitting: Nurse Practitioner

## 2013-01-25 ENCOUNTER — Other Ambulatory Visit: Payer: Self-pay

## 2013-03-22 HISTORY — PX: OTHER SURGICAL HISTORY: SHX169

## 2013-08-14 ENCOUNTER — Encounter (INDEPENDENT_AMBULATORY_CARE_PROVIDER_SITE_OTHER): Payer: Self-pay | Admitting: *Deleted

## 2013-08-23 ENCOUNTER — Ambulatory Visit (HOSPITAL_COMMUNITY)
Admission: RE | Admit: 2013-08-23 | Discharge: 2013-08-23 | Disposition: A | Discharge status: Home / Self Care | Payer: 59 | Source: Ambulatory Visit | Attending: Family Medicine | Admitting: Family Medicine

## 2013-08-23 ENCOUNTER — Encounter: Payer: Self-pay | Admitting: Family Medicine

## 2013-08-23 ENCOUNTER — Ambulatory Visit (INDEPENDENT_AMBULATORY_CARE_PROVIDER_SITE_OTHER): Payer: 59 | Admitting: Family Medicine

## 2013-08-23 VITALS — BP 132/90 | Ht 65.75 in | Wt 225.0 lb

## 2013-08-23 DIAGNOSIS — M25519 Pain in unspecified shoulder: Secondary | ICD-10-CM

## 2013-08-23 DIAGNOSIS — S46919A Strain of unspecified muscle, fascia and tendon at shoulder and upper arm level, unspecified arm, initial encounter: Secondary | ICD-10-CM

## 2013-08-23 MED ORDER — ETODOLAC 400 MG PO TABS: 400.0000 mg | ORAL_TABLET | Freq: Two times a day (BID) | ORAL | Status: DC

## 2013-08-23 NOTE — Progress Notes (Signed)
   Subjective:    Patient ID: Deborah Douglas, female    DOB: 10/01/1990, 23 y.o.   MRN: [id number]  Shoulder Pain  The pain is present in the left shoulder. This is a new problem. Episode onset: May 181th. There has been no history of extremity trauma. The problem occurs every several days. The problem has been waxing and waning. The quality of the pain is described as aching and sharp. Associated symptoms include joint locking, a limited range of motion and stiffness. The symptoms are aggravated by activity. She has tried heat, cold, rest and NSAIDS for the symptoms. The treatment provided mild relief.    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   denies chest tightness pressure pain shortness breath denies numbness in the hands     Objective:   Physical Exam Lungs clear heart regular tenderness in the left trapezius also some left rotator cuff discomfort but no sign of a rotator cuff tear no sign of nerve impingement.      Assessment & Plan:  Shoulder pain-referral to physical therapy gentle range of motion exercises anti-inflammatory not doing better in the next 2-3 weeks she is to notify us and we'll set her up with sports orthopedics

## 2013-09-17 ENCOUNTER — Other Ambulatory Visit: Payer: Self-pay | Admitting: Family Medicine

## 2013-09-20 ENCOUNTER — Telehealth: Payer: Self-pay | Admitting: Family Medicine

## 2013-09-20 NOTE — Telephone Encounter (Signed)
Pt completed PT, still having shoulder pain, you mentioned possible MRI & Ortho referral if no better, pt would like to proceed  Pt will be out of town until 09/30/13, ok to schedule anytime after then

## 2013-09-21 ENCOUNTER — Other Ambulatory Visit: Payer: Self-pay | Admitting: Family Medicine

## 2013-09-21 DIAGNOSIS — M25512 Pain in left shoulder: Secondary | ICD-10-CM

## 2013-09-21 NOTE — Telephone Encounter (Signed)
I rec pt see [facility] (  I put into the system ) plz notify pt of the process

## 2013-09-24 NOTE — Telephone Encounter (Signed)
Left message on voicemail notifying patient that referral process has been initiated.

## 2013-10-12 ENCOUNTER — Other Ambulatory Visit: Payer: Self-pay | Admitting: *Deleted

## 2013-10-12 DIAGNOSIS — M25519 Pain in unspecified shoulder: Secondary | ICD-10-CM

## 2013-10-26 ENCOUNTER — Encounter (INDEPENDENT_AMBULATORY_CARE_PROVIDER_SITE_OTHER): Payer: Self-pay | Admitting: Internal Medicine

## 2013-10-26 ENCOUNTER — Encounter (INDEPENDENT_AMBULATORY_CARE_PROVIDER_SITE_OTHER): Payer: Self-pay | Admitting: *Deleted

## 2013-10-26 ENCOUNTER — Ambulatory Visit (INDEPENDENT_AMBULATORY_CARE_PROVIDER_SITE_OTHER): Payer: 59 | Admitting: Internal Medicine

## 2013-10-26 VITALS — BP 112/70 | HR 72 | Temp 98.1°F | Ht 66.5 in | Wt 226.8 lb

## 2013-10-26 DIAGNOSIS — R1011 Right upper quadrant pain: Secondary | ICD-10-CM

## 2013-10-26 LAB — CBC WITH DIFFERENTIAL/PLATELET
Basophils Absolute: 0 10*3/uL (ref 0.0–0.1)
Basophils Relative: 0 % (ref 0–1)
EOS ABS: 0.1 10*3/uL (ref 0.0–0.7)
Eosinophils Relative: 1 % (ref 0–5)
HEMATOCRIT: 34.4 % — AB (ref 36.0–46.0)
HEMOGLOBIN: 11.5 g/dL — AB (ref 12.0–15.0)
Lymphocytes Relative: 33 % (ref 12–46)
Lymphs Abs: 2.9 10*3/uL (ref 0.7–4.0)
MCH: 25.6 pg — AB (ref 26.0–34.0)
MCHC: 33.4 g/dL (ref 30.0–36.0)
MCV: 76.4 fL — ABNORMAL LOW (ref 78.0–100.0)
MONOS PCT: 6 % (ref 3–12)
Monocytes Absolute: 0.5 10*3/uL (ref 0.1–1.0)
Neutro Abs: 5.3 10*3/uL (ref 1.7–7.7)
Neutrophils Relative %: 60 % (ref 43–77)
Platelets: 331 10*3/uL (ref 150–400)
RBC: 4.5 MIL/uL (ref 3.87–5.11)
RDW: 14.2 % (ref 11.5–15.5)
WBC: 8.8 10*3/uL (ref 4.0–10.5)

## 2013-10-26 NOTE — Patient Instructions (Signed)
Referral  To Dr. Romona Curls.

## 2013-10-26 NOTE — Progress Notes (Addendum)
Subjective:     Patient ID: Deborah Douglas Seen, female   DOB: 1991/01/02, 23 y.o.   MRN: [id number]  HPI Here today for f/u. She presents today with c/o rt upper quadrant. She says the pain is sharp and stabbing. Started 2 1/2 weeks ago. Pain related to eating.  She says the pain is constant at a 3.  She has a hx of abnormal HIDA scan and and actually was seen by Dr Romona Curls for this. She actually backed out of the surgery (cholecystectomy) She rates the pain at a 3 this am. At its worse it is a 7. Appetite is good. No weight loss She has actually gained 8 pounds since her last visit in 2014. She also has nausea. If she eats, for the most part, her nausea will be relieved.  Requesting a referral to Dr. Romona Curls   11/05/2011 HIDA scan:  IMPRESSION:  Patent biliary tree.  Abnormal gallbladder response to fatty meal stimulation with a  decreased gallbladder ejection fraction of 14%.  Normal values for gallbladder ejection fraction:  > 30% for exams utilizing sincalide (CCK)  > 33% for exams utilizing fatty meal stimulation with Ensure Plus  05/26/2011 EGD: Dr. Laural Golden: Impression:  No evidence of erosive esophagitis.  Sawtooth appearance to GE junction. Biopsy taken to rule out short segment Barrett's.  Multiple small polyps in gastric body and fundus with typical features of hyperplastic polyps. 4 of these were biopsied for histology.     Review of Systems Past Medical History  Diagnosis Date  . Migraine   . GERD (gastroesophageal reflux disease)   . Polycystic ovarian syndrome   . Morbid obesity   . Breathing difficulty   . Irritable bowel syndrome     Past Surgical History  Procedure Laterality Date  . Tonsillectomy and adenoidectomy    . Knee arthroscopy    . Esophagogastroduodenoscopy  05/26/2011    Procedure: ESOPHAGOGASTRODUODENOSCOPY (EGD);  Surgeon: Rogene Houston, MD;  Location: [facility];  Service: Endoscopy;  Laterality: N/A;  300    Allergies  Allergen Reactions  .  Isometheptene-Apap-Dichloral     Numbness    Current Outpatient Prescriptions on File Prior to Visit  Medication Sig Dispense Refill  . dicyclomine (BENTYL) 10 MG capsule Take 10 mg by mouth as needed.       . MULTIPLE VITAMIN PO Take by mouth daily.      . Norgestimate-Ethinyl Estradiol Triphasic 0.18/0.215/0.25 MG-35 MCG tablet Take 1 tablet by mouth daily.  1 Package  11  . ondansetron (ZOFRAN) 8 MG tablet Take by mouth every 8 (eight) hours as needed.       . pantoprazole (PROTONIX) 40 MG tablet TAKE (1) TABLET BY MOUTH TWICE DAILY.  60 tablet  5   No current facility-administered medications on file prior to visit.        Objective:   Physical Exam  Filed Vitals:   10/26/13 0952  BP: 112/70  Pulse: 72  Temp: 98.1 F (36.7 C)  Height: 5' 6.5" (1.689 m)  Weight: 226 lb 12.8 oz (102.876 kg)  Alert and oriented. Skin warm and dry. Oral mucosa is moist.   . Sclera anicteric, conjunctivae is pink. Thyroid not enlarged. No cervical lymphadenopathy. Lungs clear. Heart regular rate and rhythm.  Abdomen is soft. Bowel sounds are positive. No hepatomegaly. No abdominal masses felt. No tenderness. Obese.No edema to lower extremities.      Assessment:     Rt upper quadrant pain. 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 Has  been referred in the past but cancelled the appt.     Plan:     Referral to Dr. Romona Curls for evaluation.  CBC,CMET today

## 2013-10-27 LAB — COMPREHENSIVE METABOLIC PANEL
ALT: 12 U/L (ref 0–35)
AST: 11 U/L (ref 0–37)
Albumin: 3.8 g/dL (ref 3.5–5.2)
Alkaline Phosphatase: 42 U/L (ref 39–117)
BUN: 9 mg/dL (ref 6–23)
CO2: 23 mEq/L (ref 19–32)
CREATININE: 0.67 mg/dL (ref 0.50–1.10)
Calcium: 8.8 mg/dL (ref 8.4–10.5)
Chloride: 106 mEq/L (ref 96–112)
GLUCOSE: 74 mg/dL (ref 70–99)
Potassium: 4.3 mEq/L (ref 3.5–5.3)
Sodium: 137 mEq/L (ref 135–145)
Total Bilirubin: 0.2 mg/dL (ref 0.2–1.2)
Total Protein: 6.6 g/dL (ref 6.0–8.3)

## 2013-10-29 ENCOUNTER — Other Ambulatory Visit (HOSPITAL_COMMUNITY): Payer: Self-pay | Admitting: General Surgery

## 2013-10-29 DIAGNOSIS — R1011 Right upper quadrant pain: Secondary | ICD-10-CM

## 2013-10-29 DIAGNOSIS — R110 Nausea: Secondary | ICD-10-CM

## 2013-10-30 ENCOUNTER — Ambulatory Visit (INDEPENDENT_AMBULATORY_CARE_PROVIDER_SITE_OTHER): Payer: 59 | Admitting: Internal Medicine

## 2013-11-02 ENCOUNTER — Ambulatory Visit (HOSPITAL_COMMUNITY)
Admission: RE | Admit: 2013-11-02 | Discharge: 2013-11-02 | Disposition: A | Discharge status: Home / Self Care | Payer: 59 | Source: Ambulatory Visit | Attending: General Surgery | Admitting: General Surgery

## 2013-11-02 DIAGNOSIS — R110 Nausea: Secondary | ICD-10-CM

## 2013-11-02 DIAGNOSIS — R1011 Right upper quadrant pain: Secondary | ICD-10-CM | POA: Insufficient documentation

## 2013-11-05 ENCOUNTER — Encounter (HOSPITAL_COMMUNITY)
Admission: RE | Admit: 2013-11-05 | Discharge: 2013-11-05 | Disposition: A | Discharge status: Home / Self Care | Payer: 59 | Source: Ambulatory Visit | Attending: General Surgery | Admitting: General Surgery

## 2013-11-05 ENCOUNTER — Encounter (HOSPITAL_COMMUNITY): Payer: Self-pay

## 2013-11-05 DIAGNOSIS — R1011 Right upper quadrant pain: Secondary | ICD-10-CM | POA: Insufficient documentation

## 2013-11-05 DIAGNOSIS — R110 Nausea: Secondary | ICD-10-CM | POA: Diagnosis present

## 2013-11-05 MED ORDER — TECHNETIUM TC 99M MEBROFENIN IV KIT
5.0000 | PACK | Freq: Once | INTRAVENOUS | Status: AC | PRN
  Administered 2013-11-05: 5 via INTRAVENOUS

## 2013-11-05 MED ORDER — SINCALIDE 5 MCG IJ SOLR
INTRAMUSCULAR | Status: AC
  Administered 2013-11-05: 2.06 ug via INTRAVENOUS
  Filled 2013-11-05: qty 5

## 2013-11-05 MED ORDER — STERILE WATER FOR INJECTION IJ SOLN
INTRAMUSCULAR | Status: AC
  Administered 2013-11-05: 2.06 mL via INTRAVENOUS
  Filled 2013-11-05: qty 10

## 2013-11-08 ENCOUNTER — Encounter (INDEPENDENT_AMBULATORY_CARE_PROVIDER_SITE_OTHER): Payer: Self-pay | Admitting: *Deleted

## 2013-11-19 ENCOUNTER — Ambulatory Visit (INDEPENDENT_AMBULATORY_CARE_PROVIDER_SITE_OTHER): Payer: 59 | Admitting: Family Medicine

## 2013-11-19 ENCOUNTER — Encounter: Payer: Self-pay | Admitting: Family Medicine

## 2013-11-19 VITALS — BP 110/80 | Temp 97.5°F | Ht 65.75 in | Wt 225.0 lb

## 2013-11-19 DIAGNOSIS — R109 Unspecified abdominal pain: Secondary | ICD-10-CM

## 2013-11-19 DIAGNOSIS — K219 Gastro-esophageal reflux disease without esophagitis: Secondary | ICD-10-CM

## 2013-11-19 LAB — CBC WITH DIFFERENTIAL/PLATELET
BASOS PCT: 0 % (ref 0–1)
Basophils Absolute: 0 10*3/uL (ref 0.0–0.1)
EOS ABS: 0.1 10*3/uL (ref 0.0–0.7)
EOS PCT: 1 % (ref 0–5)
HCT: 37.2 % (ref 36.0–46.0)
HEMOGLOBIN: 12.3 g/dL (ref 12.0–15.0)
LYMPHS ABS: 3.2 10*3/uL (ref 0.7–4.0)
Lymphocytes Relative: 37 % (ref 12–46)
MCH: 25.7 pg — ABNORMAL LOW (ref 26.0–34.0)
MCHC: 33.1 g/dL (ref 30.0–36.0)
MCV: 77.8 fL — AB (ref 78.0–100.0)
MONOS PCT: 7 % (ref 3–12)
Monocytes Absolute: 0.6 10*3/uL (ref 0.1–1.0)
NEUTROS PCT: 55 % (ref 43–77)
Neutro Abs: 4.8 10*3/uL (ref 1.7–7.7)
Platelets: 333 10*3/uL (ref 150–400)
RBC: 4.78 MIL/uL (ref 3.87–5.11)
RDW: 14.8 % (ref 11.5–15.5)
WBC: 8.7 10*3/uL (ref 4.0–10.5)

## 2013-11-19 LAB — HEPATIC FUNCTION PANEL
ALBUMIN: 3.9 g/dL (ref 3.5–5.2)
ALK PHOS: 39 U/L (ref 39–117)
AST: 9 U/L (ref 0–37)
Bilirubin, Direct: 0.1 mg/dL (ref 0.0–0.3)
Indirect Bilirubin: 0.1 mg/dL — ABNORMAL LOW (ref 0.2–1.2)
TOTAL PROTEIN: 6.6 g/dL (ref 6.0–8.3)
Total Bilirubin: 0.2 mg/dL (ref 0.2–1.2)

## 2013-11-19 LAB — AMYLASE: AMYLASE: 27 U/L (ref 0–105)

## 2013-11-19 LAB — LIPASE: Lipase: 14 U/L (ref 0–75)

## 2013-11-19 MED ORDER — ONDANSETRON HCL 8 MG PO TABS: 8.0000 mg | ORAL_TABLET | Freq: Three times a day (TID) | ORAL | Status: DC | PRN

## 2013-11-19 MED ORDER — PROMETHAZINE HCL 25 MG PO TABS: 25.0000 mg | ORAL_TABLET | Freq: Two times a day (BID) | ORAL | Status: DC | PRN

## 2013-11-19 NOTE — Progress Notes (Signed)
   Subjective:    Patient ID: Rogelio Seen, female    DOB: [birth year], 23 y.o.   MRN: [id number]  Abdominal Pain This is a recurrent problem. The current episode started more than 1 month ago. The onset quality is gradual. The problem occurs constantly. The problem has been unchanged. The pain is located in the generalized abdominal region. The pain is at a severity of 9/10. The pain is moderate. The quality of the pain is sharp. The abdominal pain radiates to the back. Associated symptoms include nausea. Pertinent negatives include no diarrhea or frequency. Nothing aggravates the pain. The pain is relieved by nothing. Treatments tried: protonix, zofran. The treatment provided no relief.   Patient states she has no other concerns at this time.  Long discussion held with the patient regarding this. It started off with intermittent abdominal pain that was in the right upper quadrant initially she had a HIDA test which was abnormal then a followup HIDA test just done recently was normal she had seen gastroenterology. She saw that PA. They didn't do an endoscopy 2 years ago. With her having ongoing pain and discomfort it was recommended by their office that she see the gastroenterologist himself but she would not be able to be seen until November. She does have intermittent loose stools she's not losing weight she relates morning nausea also right upper quadrant pain worse with eating.  Review of Systems  Constitutional: Negative for activity change, appetite change and fatigue.  Respiratory: Negative for cough and shortness of breath.   Cardiovascular: Negative for chest pain.  Gastrointestinal: Positive for nausea and abdominal pain. Negative for diarrhea.  Endocrine: Negative for polydipsia and polyphagia.  Genitourinary: Negative for frequency.  Neurological: Negative for weakness.  Psychiatric/Behavioral: Negative for confusion.       Objective:   Physical Exam  Vitals  reviewed. Constitutional: She appears well-nourished. No distress.  Cardiovascular: Normal rate, regular rhythm and normal heart sounds.   No murmur heard. Pulmonary/Chest: Effort normal and breath sounds normal. No respiratory distress.  Abdominal: Soft.  Has mild tenderness right mid quadrant right upper quadrant  Musculoskeletal: She exhibits no edema.  Lymphadenopathy:    She has no cervical adenopathy.  Neurological: She is alert. She exhibits normal muscle tone.  Psychiatric: Her behavior is normal.          Assessment & Plan:  #1 abdominal pain-has been hard to figure out exactly what is causing this. His face is significant intervention. I would recommend lab work. Her last endoscopy 2000. With her HIDA scan being normal it is not apparent that this is her gallbladder. We will need to run tests to look for other etiology. She may need to have endoscopy or colonoscopy to rule out other pathology including celiac/irritable bowel/inflammatory bowel  After lab work will speak with Dr.Rehman personally regarding these findings  25 minutes spent with patient

## 2013-11-20 LAB — IGA: IgA: 113 mg/dL (ref 69–380)

## 2013-11-20 LAB — TISSUE TRANSGLUTAMINASE, IGA: Tissue Transglutaminase Ab, IgA: 4.3 U/mL (ref <20)

## 2013-11-20 LAB — SEDIMENTATION RATE: Sed Rate: 12 mm/hr (ref 0–22)

## 2013-12-03 ENCOUNTER — Telehealth: Payer: Self-pay | Admitting: Family Medicine

## 2013-12-03 NOTE — Telephone Encounter (Signed)
Pt needs this today if at all possible, she thought she gave this form to you  When she was here for her last appt. 11/19/13,   She is trying to get into the [employer] an needs this form to complete her application Which is deadlined in [place]  Has to leave by 9am tomorrow to do to [facility] for this application process.

## 2013-12-03 NOTE — Telephone Encounter (Signed)
Form up front for pick up. Patient notified.

## 2013-12-03 NOTE — Telephone Encounter (Signed)
Signed, please call pt

## 2013-12-06 ENCOUNTER — Ambulatory Visit (INDEPENDENT_AMBULATORY_CARE_PROVIDER_SITE_OTHER): Payer: 59 | Admitting: Internal Medicine

## 2013-12-06 ENCOUNTER — Encounter (INDEPENDENT_AMBULATORY_CARE_PROVIDER_SITE_OTHER): Payer: Self-pay | Admitting: Internal Medicine

## 2013-12-06 VITALS — BP 122/74 | HR 76 | Temp 98.8°F | Resp 18 | Ht 66.5 in | Wt 224.3 lb

## 2013-12-06 DIAGNOSIS — Z8639 Personal history of other endocrine, nutritional and metabolic disease: Secondary | ICD-10-CM | POA: Insufficient documentation

## 2013-12-06 DIAGNOSIS — K219 Gastro-esophageal reflux disease without esophagitis: Secondary | ICD-10-CM

## 2013-12-06 DIAGNOSIS — K589 Irritable bowel syndrome without diarrhea: Secondary | ICD-10-CM | POA: Insufficient documentation

## 2013-12-06 DIAGNOSIS — R1011 Right upper quadrant pain: Secondary | ICD-10-CM

## 2013-12-06 MED ORDER — OMEPRAZOLE-SODIUM BICARBONATE 40-1100 MG PO CAPS: 1.0000 | ORAL_CAPSULE | Freq: Every day | ORAL | Status: DC

## 2013-12-06 NOTE — Patient Instructions (Addendum)
Office visit on 01/28/2014. Can take Zegerid or omeprazole/sodium bicarbonate before evening meal or at bedtime. Keep symptom diary as discussed.

## 2013-12-06 NOTE — Progress Notes (Signed)
Presenting complaint;  Right upper quadrant pain, nausea and recurrent heartburn.  Subjective:  Patient is 23 year old Caucasian female who is here for reevaluation of her symptoms by request by Dr. Sallee Lange. Patient has chronic GERD and underwent EGD to a half years ago revealing mild changes of reflux esophagitis and had fundic-type gastric polyps. She has been maintained on double dose PPI . She has been evaluated for right upper quadrant by me on 3 different occasions. In August 2013 HIDA revealed low EF of 14%. She was seen by  Dr. Romona Curls who felt cholecystectomy was not indicated. She was seen by Ms. Setzer, NP last month and was referred back to Dr. Romona Curls. He repeated upper abdominal ultrasound which was negative for cholelithiasis. This was followed by HIDA scan with CCK revealing EF of 95%. 2 years earlier her EF was 14%. He felt HIDA scan was normal and did not recommend cholecystectomy. Patient is accompanied by her mother. She has multiple complaints. Lately she's had constant right upper quadrant pain which she describes as dilating pain. However at times she has stabbing and squeezing pain which radiates posteriorly into her back. She also complains of nausea which is worse when she wakes up and may last for couple hours. She does not necessarily get better with the meals but usually does not eat breakfast. She denies vomiting. She also has severe catching pain in this area and when this occurs she cannot stand straight or upright. She also complains of frequent heartburn and burping and is using Pepto-Bismol every day. Her appetite is fair. She denies involuntary weight loss. She stays busy. She plays kickball, does cross that plays golf and runs 2-5 miles at least 3 times a week and she also swims twice a week. She quit her physical activity for 3 weeks but it did not ameliorate her pain. She remains with irregular bowel movements. Recently she had diarrhea lasting two weeks. She  had celiac antibody panel by Dr.Luking and was negative. She denies melena or rectal bleeding. She takes dicyclomine when she has spells of diarrhea. She denies NSAID use.  Current Medications: Outpatient Encounter Prescriptions as of 12/06/2013  Medication Sig  . dicyclomine (BENTYL) 10 MG capsule Take 10 mg by mouth as needed.   . Iron Combinations (IRON COMPLEX PO) Take by mouth.  . MULTIPLE VITAMIN PO Take by mouth daily.  . Norgestimate-Ethinyl Estradiol Triphasic 0.18/0.215/0.25 MG-35 MCG tablet Take 1 tablet by mouth daily.  . ondansetron (ZOFRAN) 8 MG tablet Take 1 tablet (8 mg total) by mouth every 8 (eight) hours as needed.  . pantoprazole (PROTONIX) 40 MG tablet TAKE (1) TABLET BY MOUTH TWICE DAILY.  Marland Kitchen promethazine (PHENERGAN) 25 MG tablet Take 1 tablet (25 mg total) by mouth 2 (two) times daily as needed for nausea or vomiting.     Objective: Blood pressure 122/74, pulse 76, temperature 98.8 F (37.1 C), temperature source Oral, resp. rate 18, height 5' 6.5" (1.689 m), weight 224 lb 4.8 oz (101.742 kg), last menstrual period 12/05/2013. Patient is alert and in no acute distress. Conjunctiva is pink. Sclera is nonicteric Oropharyngeal mucosa is normal. No neck masses or thyromegaly noted. Cardiac exam with regular rhythm normal S1 and S2. No murmur or gallop noted. Lungs are clear to auscultation. Abdomen is full. Bowel sounds are normal. On palpation abdomen is soft. She has mild tenderness below the right costal margin lateral to midclavicular line. No guarding or rebound noted. No organomegaly or masses.  No LE edema or  clubbing noted.  Labs/studies Results: CBC, LFTs, serum amylase and lipase results noted from 11/19/2013.all results are normal .   Assessment:  #1. Recurrent right upper quadrant pain. Pain appears to be multifactorial. Some of her pain appears to be musculoskeletal but she may also have right-sided pain due to IBS. Gallbladder disease remains a  possibility. 2 years ago she had EF of 14% and now it is 95%. These results may suggest biliary dyskinesia.  #2. GERD. Symptoms poorly controlled with double dose pantoprazole. #3. Irritable bowel syndrome. #4. History of iron deficiency. Recent H&H is normal.   Plan:  Discontinue pantoprazole. Zegerid 40 mg by mouth daily. She can take it either before evening meal or at bedtime. Patient advised to keep symptom diary for the next few weeks. Will discuss HIDA scan findings with Dr. Romona Curls. Office visit in 2 months.

## 2014-01-01 ENCOUNTER — Telehealth (INDEPENDENT_AMBULATORY_CARE_PROVIDER_SITE_OTHER): Payer: Self-pay | Admitting: *Deleted

## 2014-01-01 NOTE — Telephone Encounter (Signed)
Patient called wanting to know about appt with surgeon in [place] -- I don't see that in your lat prog note -- please advise

## 2014-01-02 NOTE — Telephone Encounter (Signed)
I talked with Dr. Romona Curls; please ask patient to call his office for another appointment.

## 2014-01-03 NOTE — Telephone Encounter (Signed)
Patient aware.

## 2014-01-10 ENCOUNTER — Encounter: Payer: Self-pay | Admitting: Family Medicine

## 2014-01-10 ENCOUNTER — Other Ambulatory Visit: Payer: Self-pay | Admitting: Family Medicine

## 2014-01-10 DIAGNOSIS — R1011 Right upper quadrant pain: Secondary | ICD-10-CM

## 2014-01-10 NOTE — Progress Notes (Signed)
Patient desiring second opinion. Ultrasound HIDA looks good but has ongoing right upper quadrant pain would like second opinion from a gastroenterologist.

## 2014-01-17 ENCOUNTER — Ambulatory Visit (INDEPENDENT_AMBULATORY_CARE_PROVIDER_SITE_OTHER): Payer: 59 | Admitting: Otolaryngology

## 2014-01-17 DIAGNOSIS — J310 Chronic rhinitis: Secondary | ICD-10-CM

## 2014-01-17 DIAGNOSIS — J343 Hypertrophy of nasal turbinates: Secondary | ICD-10-CM

## 2014-01-28 ENCOUNTER — Telehealth (INDEPENDENT_AMBULATORY_CARE_PROVIDER_SITE_OTHER): Payer: Self-pay | Admitting: *Deleted

## 2014-01-28 ENCOUNTER — Ambulatory Visit (INDEPENDENT_AMBULATORY_CARE_PROVIDER_SITE_OTHER): Payer: 59 | Admitting: Internal Medicine

## 2014-01-28 NOTE — Telephone Encounter (Signed)
Patient is sick and wants to canel today's appt.

## 2014-01-28 NOTE — Telephone Encounter (Signed)
Apt has been canceled.

## 2014-01-30 ENCOUNTER — Ambulatory Visit (INDEPENDENT_AMBULATORY_CARE_PROVIDER_SITE_OTHER): Payer: 59 | Admitting: Family Medicine

## 2014-01-30 ENCOUNTER — Encounter: Payer: Self-pay | Admitting: Family Medicine

## 2014-01-30 VITALS — BP 126/72 | Temp 98.6°F | Ht 66.0 in | Wt 222.0 lb

## 2014-01-30 DIAGNOSIS — J329 Chronic sinusitis, unspecified: Secondary | ICD-10-CM

## 2014-01-30 MED ORDER — BENZONATATE 100 MG PO CAPS: 100.0000 mg | ORAL_CAPSULE | Freq: Four times a day (QID) | ORAL | Status: DC | PRN

## 2014-01-30 MED ORDER — CLARITHROMYCIN 500 MG PO TABS: 500.0000 mg | ORAL_TABLET | Freq: Two times a day (BID) | ORAL | Status: DC

## 2014-01-30 NOTE — Progress Notes (Signed)
   Subjective:    Patient ID: Deborah Douglas Seen, female    DOB: [birth year], 23 y.o.   MRN: [id number]  Cough This is a new problem. The current episode started 1 to 4 weeks ago. Associated symptoms include chills, headaches, nasal congestion, a sore throat and wheezing. Associated symptoms comments: Fluid in ears, dizzy, . Treatments tried: tylenol cold and flu, tylenol sinus, vicks vabor rub.    Started on last mon  Sore throat bad, also sig chills, and cough not until later  Front of face and head nd achey   Cough got worse,  Bronchial pain with deep breath  tylen sinus prn, vicks, tyl cold and flu,  No hi temps, but bad chilnon productive  Mo hx of inaler use No energy  Min appetite     Review of Systems  Constitutional: Positive for chills.  HENT: Positive for sore throat.   Respiratory: Positive for cough and wheezing.   Neurological: Positive for headaches.       Objective:   Physical Exam   Alert moderate malaise frontal headache pharynx normal neck supple lungs rare wheeze heart regular in rhythm.     Assessment & Plan:  Impression rhinosinusitis/bronchitis with reactive airways plan Ventolin when necessary. Antibiotics prescribed. Since Medicare discussed. WSL

## 2014-02-05 DIAGNOSIS — R197 Diarrhea, unspecified: Secondary | ICD-10-CM | POA: Insufficient documentation

## 2014-02-05 NOTE — Progress Notes (Signed)
 GI Clinic Follow-up Note  Dear Dr. Alphonsa,  Thank you for allowing us  to see Shahrzad Koble in the [facility] clinic on 02/05/2014.  Please review our records as below:  HPI:  Deborah Douglas is a 23 y.o. year old female  The patient is currently being seen for evaluation of right upper quadrant pain present for the past two and a half years associated with irregular bowel habits consisting of diarrhea and constipation.  According to the patient, she has daily low-grade right upper quadrant pain which radiates around to the back.  This pain can be exacerbated with her experiencing spells lasting up to four months of daily severe right upper quadrant pain associated with nausea.  These symptoms tend to be worse during periods of diarrhea.  The patient's bowel habits vary from constipation to diarrhea.  Diarrhea is loose, nonbloody, and can occur several times per day.  The right upper quadrant pain does not appear to improve following a bowel movement.  Symptoms have not been associated with fever, chills, weight loss or hematochezia.  She does have a history of intermittent burning epigastric and substernal discomfort ascribed to esophagitis, having undergone three endoscopies over the past two years.  Additional diagnostic studies include an abnormal HIDA scan in 2013 with an ejection fraction of 14%.  Subsequent HIDA scan in August 2015 was entirely normal with 95% ejection.  Abdominal ultrasound and 2013 and 2015 have been normal.  CBC and CMP are normal except for a history of transient iron deficiency anemia.  Denies undergoing abdominal CT scan or other diagnostic studies.  Denies arthralgias, mouth ulcers or other associated symptoms.  The patient's symptoms persist in spite of Bentyl  taken on an as needed basis.  Denies excessive caffeine  utilization and denies any other precipitating or alleviating factors.  ROS:  A complete review of systems was  otherwise negative, except as noted in the HPI.  Medications: Current Outpatient Prescriptions  Medication Sig Dispense Refill  . acetaminophen (TYLENOL EXTRA STRENGTH) 500 MG tablet Take 1,000 mg by mouth every 6 (six) hours as needed for Pain.    SABRA b complex vitamins tablet Take 1 tablet by mouth daily.    . clarithromycin  (BIAXIN ) 500 MG tablet Take 500 mg by mouth 2 times daily.     SABRA ibuprofen (ADVIL,MOTRIN) 200 MG tablet Take 200 mg by mouth every 6 (six) hours as needed for Pain.    SABRA ipratropium (ATROVENT) 0.06 % nasal spray 1 spray by Each Nare route daily.     . multivitamin with minerals tablet Take 1 tablet by mouth daily.    . omeprazole -sodium bicarbonate  (ZEGERID ) 40-1.1 mg-gram per capsule Take 1 capsule by mouth daily before breakfast.     . TRI-SPRINTEC, 28, 0.18/0.215/0.25 mg-35 mcg (28) tablet Take 1 tablet by mouth daily.     . hyoscyamine (ANASPAZ,LEVSIN) 0.125 mg tablet Take 1 tablet (0.125 mg total) by mouth 4 times daily before meals and nightly. 120 tablet 2   No current facility-administered medications for this visit.    Physical Exam Blood pressure 124/56, pulse 89, temperature 98.1 F (36.7 C), temperature source Oral, height 1.676 m (5' 6), weight 99.791 kg (220 lb), last menstrual period 02/03/2014, SpO2 98 %. General: Patient appears stated age; alert, cooperative, in no distress Head: Normocephalic, atraumatic, without obvious abnormality. Skin: Skin color and turgor are normal; there are no rashes or lesions; no xanthalasma Extremites: no acrocyanosis, clubbing, or edema. Eyes: PERRL, no  conjunctival injection or icterus; EOM's intact. ENT: Tongue normal; no oropharyngeal inflammation or lesions; no sinus tenderness. Neck: Supple, trachea midline: no bruits or JVD; no thyroid  enlargement Lymphatics: No cervical, supraclavicular, axillary, or inguinal adenopathy. Back: Symmetric, no curvature or deformity; no CVA tenderness Chest: Clear to auscultation  bilaterally with no wheezes, crackles or rhonchi, no respiratory distress. Heart: Pulses 2+ and symmetric: S1S2 regular, without murmur or gallop. Abdomen: Soft, non-distended, with normal bowel sounds and no bruits; liver span 10 cm at [facility]; no evident mass or splenomegaly; no evident ascites; no evident hernias; no abdominal tenderness to palpation GU: Genital examination not performed Rectal: Rectal exam not performed. Neurological: Alert and oriented; motor and sensory function grossly intact; no tremor or asterixis; Psychiatric: interactive; appropriate affect; voices no depressive ideation Labs: No results found for: ALT, AST, GGT, ALKPHOS, BILITOT No results found for: INR, PROTIME No results found for: CREATININE, BUN, NA, K, CL, CO2 No results found for: WBC, HGB, HCT, MCV, PLT   Assessment and Plan:   ICD-10-CM   1. RUQ abdominal pain R10.11 Gallbladder emptying scan  2. Diarrhea R19.7 C-Reactive Protein    CBC   Suspect the current symptoms of right upper quadrant pain and irregular bowel habits, especially diarrhea, most likely due to an underlying irritable bowel syndrome rather than secondary to biliary dyskinesia.  Recommend a trial of Levsin 0.125 mg a.c. and h.s. and a low FODMAP diet.  Repeat HIDA scan pending.  If HIDA scan is normal, consider CT of abdomen to rule out the remote possibility of inflammatory bowel disease contributing to her symptoms, if her symptoms do not respond to these measures.   Ozell JULIANNA Core, MD, MD, NOLIA, AGAF Assistant Professor of Medicine

## 2014-02-18 ENCOUNTER — Ambulatory Visit (INDEPENDENT_AMBULATORY_CARE_PROVIDER_SITE_OTHER): Payer: 59 | Admitting: Nurse Practitioner

## 2014-02-18 ENCOUNTER — Encounter: Payer: Self-pay | Admitting: Nurse Practitioner

## 2014-02-18 VITALS — BP 110/82 | Ht 66.0 in | Wt 226.8 lb

## 2014-02-18 DIAGNOSIS — L259 Unspecified contact dermatitis, unspecified cause: Secondary | ICD-10-CM

## 2014-02-18 MED ORDER — BETAMETHASONE DIPROPIONATE 0.05 % EX CREA: TOPICAL_CREAM | Freq: Two times a day (BID) | CUTANEOUS | Status: DC

## 2014-02-19 ENCOUNTER — Encounter: Payer: Self-pay | Admitting: Nurse Practitioner

## 2014-02-19 NOTE — Progress Notes (Signed)
Subjective:  Presents for localized rash around right ear. No other rash noted. Very pruritic. Wears an ear device at work only on right ear.  Objective:   BP 110/82 mmHg  Ht 5\' 6"  (1.676 m)  Wt 226 lb 12.8 oz (102.876 kg)  BMI 36.62 kg/m2 Confluent well defined pink papular rash noted around right ear. Face and scalp otherwise clear.  Assessment: Contact dermatitis  Plan:  Meds ordered this encounter  Medications  . betamethasone dipropionate (DIPROLENE) 0.05 % cream    Sig: Apply topically 2 (two) times daily.    Dispense:  30 g    Refill:  0    Order Specific Question:  Supervising Provider    Answer:  Mikey Kirschner [[id number]]  avoid exposure to cleaning products used on ear device. Call back if persists.

## 2014-04-11 ENCOUNTER — Other Ambulatory Visit (INDEPENDENT_AMBULATORY_CARE_PROVIDER_SITE_OTHER): Payer: Self-pay | Admitting: Internal Medicine

## 2014-04-11 DIAGNOSIS — K219 Gastro-esophageal reflux disease without esophagitis: Secondary | ICD-10-CM

## 2014-04-11 MED ORDER — OMEPRAZOLE-SODIUM BICARBONATE 40-1100 MG PO CAPS: 1.0000 | ORAL_CAPSULE | Freq: Every day | ORAL | Status: DC

## 2014-04-11 NOTE — Telephone Encounter (Signed)
Rx reordered.  

## 2014-04-15 ENCOUNTER — Other Ambulatory Visit (INDEPENDENT_AMBULATORY_CARE_PROVIDER_SITE_OTHER): Payer: Self-pay | Admitting: Internal Medicine

## 2014-04-15 ENCOUNTER — Telehealth (INDEPENDENT_AMBULATORY_CARE_PROVIDER_SITE_OTHER): Payer: Self-pay | Admitting: Internal Medicine

## 2014-04-15 DIAGNOSIS — K219 Gastro-esophageal reflux disease without esophagitis: Secondary | ICD-10-CM

## 2014-04-15 MED ORDER — OMEPRAZOLE-SODIUM BICARBONATE 40-1100 MG PO CAPS: 1.0000 | ORAL_CAPSULE | Freq: Every day | ORAL | Status: DC

## 2014-04-15 NOTE — Telephone Encounter (Signed)
Rx sent 

## 2014-04-15 NOTE — Telephone Encounter (Signed)
Rx sent to [facility]

## 2014-05-09 ENCOUNTER — Ambulatory Visit (INDEPENDENT_AMBULATORY_CARE_PROVIDER_SITE_OTHER): Payer: 59 | Admitting: Family Medicine

## 2014-05-09 ENCOUNTER — Encounter: Payer: Self-pay | Admitting: Family Medicine

## 2014-05-09 VITALS — BP 130/78 | Temp 98.1°F | Ht 66.0 in | Wt 216.4 lb

## 2014-05-09 DIAGNOSIS — L259 Unspecified contact dermatitis, unspecified cause: Secondary | ICD-10-CM

## 2014-05-09 MED ORDER — PREDNISONE 20 MG PO TABS: ORAL_TABLET | ORAL | Status: DC

## 2014-05-09 MED ORDER — MOMETASONE FUROATE 0.1 % EX CREA: TOPICAL_CREAM | CUTANEOUS | Status: DC

## 2014-05-09 NOTE — Progress Notes (Signed)
   Subjective:    Patient ID: Deborah Douglas, female    DOB: 04/30/1990, 24 y.o.   MRN: [id number]  Rash This is a new problem. The current episode started in the past 7 days. The problem is unchanged. The affected locations include the abdomen. The rash is characterized by redness and itchiness. It is unknown if there was an exposure to a precipitant. Past treatments include anti-itch cream. The treatment provided no relief.   Patient states she recently has gallbladder surgery and the rash occurred post surgery at the incision sites. No other concerns at this time.    Review of Systems  Skin: Positive for rash.   denies fever chills sweat relates a lot of itching burning discomfort at the incision sites     Objective:   Physical Exam   extensive contact dermatitis noted on multiple areas where surgical procedure was completed.      Assessment & Plan:   extensive contact dermatitis noted on multiple areas on the abdomen. No sinus cellulitis , prednisone short course, steroid creams, follow-up of problems

## 2014-05-28 ENCOUNTER — Other Ambulatory Visit: Payer: Self-pay | Admitting: Family Medicine

## 2014-05-30 ENCOUNTER — Ambulatory Visit (INDEPENDENT_AMBULATORY_CARE_PROVIDER_SITE_OTHER): Payer: 59 | Admitting: Nurse Practitioner

## 2014-05-30 ENCOUNTER — Encounter: Payer: Self-pay | Admitting: Nurse Practitioner

## 2014-05-30 VITALS — BP 122/80 | Ht 66.0 in | Wt 215.0 lb

## 2014-05-30 DIAGNOSIS — M6248 Contracture of muscle, other site: Secondary | ICD-10-CM | POA: Diagnosis not present

## 2014-05-30 DIAGNOSIS — M62838 Other muscle spasm: Secondary | ICD-10-CM

## 2014-05-30 DIAGNOSIS — G43909 Migraine, unspecified, not intractable, without status migrainosus: Secondary | ICD-10-CM | POA: Insufficient documentation

## 2014-05-30 DIAGNOSIS — G43009 Migraine without aura, not intractable, without status migrainosus: Secondary | ICD-10-CM

## 2014-05-30 MED ORDER — RIZATRIPTAN BENZOATE 10 MG PO TBDP: 10.0000 mg | ORAL_TABLET | ORAL | Status: DC | PRN

## 2014-05-30 MED ORDER — NAPROXEN 500 MG PO TABS: 500.0000 mg | ORAL_TABLET | Freq: Two times a day (BID) | ORAL | Status: DC

## 2014-05-30 MED ORDER — TOPIRAMATE 50 MG PO TABS: ORAL_TABLET | ORAL | Status: DC

## 2014-05-30 NOTE — Patient Instructions (Signed)
Ice/heat applications Stretching OTC TENS unit [facility]

## 2014-06-01 ENCOUNTER — Encounter: Payer: Self-pay | Admitting: Nurse Practitioner

## 2014-06-01 NOTE — Progress Notes (Signed)
Subjective:  Presents for c/o exacerbation of her migraines. Frontal area headache; throbbing radiates into jaw. No photosensitivity or phonophobia. Eyes hurt during headache but no visual changes. Unassociated with menses. On same pill; no missed pills. Sporadic. Max 5 hours. No relief with Excedrin. No numbness or weakness of face, arms and legs.  Having headache about once every 3 days. Stress better.   Objective:   BP 122/80 mmHg  Ht 5\' 6"  (1.676 m)  Wt 215 lb (97.523 kg)  BMI 34.72 kg/m2 NAD. Alert, oriented. Lungs clear. Heart RRR. Tight tender muscles noted cervical and lateral neck area.   Assessment:  Problem List Items Addressed This Visit      Cardiovascular and Mediastinum   Migraine - Primary   Relevant Medications   topiramate (TOPAMAX) tablet   rizatriptan (MAXALT MLT) disintegrating tablet   naproxen (NAPROSYN) tablet   HYDROcodone-acetaminophen (NORCO/VICODIN) 5-325 MG per tablet    Other Visit Diagnoses    Muscle spasms of neck           Note: pain medicine prescribed by another provider following gallbladder surgery  Plan:  Meds ordered this encounter  Medications  . topiramate (TOPAMAX) 50 MG tablet    Sig: One po qhs for migraines    Dispense:  30 tablet    Refill:  5    Order Specific Question:  Supervising Provider    Answer:  Mikey Kirschner [[id number]]  . rizatriptan (MAXALT-MLT) 10 MG disintegrating tablet    Sig: Take 1 tablet (10 mg total) by mouth as needed for migraine. May repeat in 2 hours if needed; max 2 per 24 hrs    Dispense:  10 tablet    Refill:  5    Order Specific Question:  Supervising Provider    Answer:  Mikey Kirschner [[id number]]  . naproxen (NAPROSYN) 500 MG tablet    Sig: Take 1 tablet (500 mg total) by mouth 2 (two) times daily with a meal. Prn pain or headache    Dispense:  60 tablet    Refill:  0    Order Specific Question:  Supervising Provider    Answer:  Mikey Kirschner [[id number]]  . HYDROcodone-acetaminophen (NORCO/VICODIN)  5-325 MG per tablet    Sig:    Probable element of rebound headache. Restart Topamax. Limit analgesic use. Take Maxalt and Naproxen at onset of migraine. Discussed risk of Topamax and pregnancy.  Ice/heat applications Stretching OTC TENS unit [facility] Return in about 6 months (around 11/30/2014) for recheck. Call back sooner if needed.

## 2014-06-25 ENCOUNTER — Encounter: Payer: Self-pay | Admitting: Family Medicine

## 2014-06-25 ENCOUNTER — Ambulatory Visit (INDEPENDENT_AMBULATORY_CARE_PROVIDER_SITE_OTHER): Payer: 59 | Admitting: Family Medicine

## 2014-06-25 VITALS — BP 122/78 | Temp 98.5°F | Ht 66.0 in | Wt 210.0 lb

## 2014-06-25 DIAGNOSIS — B349 Viral infection, unspecified: Secondary | ICD-10-CM

## 2014-06-25 DIAGNOSIS — R6889 Other general symptoms and signs: Secondary | ICD-10-CM

## 2014-06-25 NOTE — Progress Notes (Signed)
   Subjective:    Patient ID: Deborah Douglas, female    DOB: [birth year], 24 y.o.   MRN: [id number]  Sore Throat  This is a new problem. The current episode started yesterday. The problem has been gradually worsening. There has been no fever. Associated symptoms include congestion, coughing, headaches and a hoarse voice. Pertinent negatives include no ear pain or shortness of breath. Associated symptoms comments: Body aches, weakness. She has tried acetaminophen (last night) for the symptoms. The treatment provided mild relief.   she relates it started yesterday with sore throat body aches tired some cough runny nose as well  PMH benign  Review of Systems  Constitutional: Negative for fever and activity change.  HENT: Positive for congestion, hoarse voice and rhinorrhea. Negative for ear pain.   Eyes: Negative for discharge.  Respiratory: Positive for cough. Negative for shortness of breath and wheezing.   Cardiovascular: Negative for chest pain.  Neurological: Positive for headaches.       Objective:   Physical Exam  Constitutional: She appears well-developed.  HENT:  Head: Normocephalic.  Nose: Nose normal.  Mouth/Throat: Oropharynx is clear and moist. No oropharyngeal exudate.  Neck: Neck supple.  Cardiovascular: Normal rate and normal heart sounds.   No murmur heard. Pulmonary/Chest: Effort normal and breath sounds normal. She has no wheezes.  Lymphadenopathy:    She has no cervical adenopathy.  Skin: Skin is warm and dry.  Nursing note and vitals reviewed.   Throat has a normal appearance I find no evidence of strep throat      Assessment & Plan:  Viral syndrome Flulike illness No antibiotics indicated If ongoing trouble notify us Patient at risk of developing sinus infection and bronchitis Call us if any problems She was educated what signs to watch for and call back if issues

## 2014-06-28 ENCOUNTER — Telehealth: Payer: Self-pay | Admitting: Family Medicine

## 2014-06-28 MED ORDER — LEVOFLOXACIN 500 MG PO TABS: 500.0000 mg | ORAL_TABLET | Freq: Every day | ORAL | Status: DC

## 2014-06-28 NOTE — Telephone Encounter (Signed)
[facility] -  (need to know symptoms)

## 2014-06-28 NOTE — Telephone Encounter (Signed)
Pt called stating that her symptoms have gotten worse and would like For an antibiotic to be called in.  [employer]

## 2014-06-28 NOTE — Telephone Encounter (Signed)
Patient states she has a sore throat, bad cough, yellow congestion since Monday. No fever, wheezing or sob. Per Dr. Nicki Reaper- Levaquin 500 mg 1 tab daily. Recheck is not better in 5-6 days. Patient verbalized understanding.

## 2014-08-06 ENCOUNTER — Ambulatory Visit (INDEPENDENT_AMBULATORY_CARE_PROVIDER_SITE_OTHER): Payer: 59 | Admitting: Family Medicine

## 2014-08-06 ENCOUNTER — Encounter: Payer: Self-pay | Admitting: Family Medicine

## 2014-08-06 VITALS — BP 120/70 | Temp 98.5°F | Ht 66.0 in | Wt 207.0 lb

## 2014-08-06 DIAGNOSIS — R300 Dysuria: Secondary | ICD-10-CM | POA: Diagnosis not present

## 2014-08-06 LAB — POCT URINALYSIS DIPSTICK
Blood, UA: 50
PH UA: 7
Spec Grav, UA: 1.015

## 2014-08-06 MED ORDER — CEFUROXIME AXETIL 500 MG PO TABS: 500.0000 mg | ORAL_TABLET | Freq: Two times a day (BID) | ORAL | Status: DC

## 2014-08-06 MED ORDER — PHENAZOPYRIDINE HCL 200 MG PO TABS: 200.0000 mg | ORAL_TABLET | Freq: Three times a day (TID) | ORAL | Status: DC | PRN

## 2014-08-06 NOTE — Progress Notes (Signed)
   Subjective:    Patient ID: Deborah Douglas, female    DOB: Mar 30, 1990, 24 y.o.   MRN: [id number]  Urinary Tract Infection  This is a new problem. The current episode started in the past 7 days. The problem occurs intermittently. The problem has been unchanged. The quality of the pain is described as burning. The pain is moderate. There has been no fever. Associated symptoms include frequency and urgency. She has tried nothing for the symptoms. The treatment provided no relief.   Patient states that her lip is swollen and red after an accident she had on Saturday. Treatments tried: Neosporin. Patient wants to make sure it is not infected.    Review of Systems  Genitourinary: Positive for urgency and frequency.   No fevers no vomiting diarrhea does has some tenderness on the lip where she accidentally had a bit when she fell over the weekend    Objective:   Physical Exam Lungs clear heart regular flanks nontender abdomen soft a bite injury noted on her lower chin inner lip ROM accident over the weekend       Assessment & Plan:  Accidental bite-Ceftin 500 mg twice a day for the next 10 days if progressive troubles or worse follow-up call if problems  UTI Pyridium and Ceftin should gradually get better having any troubles to let us know

## 2014-08-21 ENCOUNTER — Telehealth: Payer: Self-pay | Admitting: Family Medicine

## 2014-08-21 NOTE — Telephone Encounter (Signed)
error 

## 2014-08-22 ENCOUNTER — Ambulatory Visit (INDEPENDENT_AMBULATORY_CARE_PROVIDER_SITE_OTHER): Payer: 59 | Admitting: Nurse Practitioner

## 2014-08-22 ENCOUNTER — Encounter: Payer: Self-pay | Admitting: Nurse Practitioner

## 2014-08-22 VITALS — BP 126/84 | Temp 98.3°F | Ht 66.0 in | Wt 205.1 lb

## 2014-08-22 DIAGNOSIS — S43432D Superior glenoid labrum lesion of left shoulder, subsequent encounter: Secondary | ICD-10-CM

## 2014-08-23 ENCOUNTER — Encounter: Payer: Self-pay | Admitting: Nurse Practitioner

## 2014-08-23 DIAGNOSIS — S43439A Superior glenoid labrum lesion of unspecified shoulder, initial encounter: Secondary | ICD-10-CM | POA: Insufficient documentation

## 2014-08-23 NOTE — Progress Notes (Signed)
Subjective:  Presents to discuss possible left shoulder surgery. Has done several alternatives with minimal relief including  6 months of therapy and one injection. Her orthopedic specialist has advised her that surgery is her only option at this time. Wanted to review with our office first. Has been told she will have some permanent limitations after surgery. 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   Objective:   BP 126/84 mmHg  Temp(Src) 98.3 F (36.8 C) (Oral)  Ht 5\' 6"  (1.676 m)  Wt 205 lb 2 oz (93.044 kg)  BMI 33.12 kg/m2  LMP 08/19/2014   Assessment:  Problem List Items Addressed This Visit      Musculoskeletal and Integument   Labral tear of shoulder - Primary     Plan: reviewed information on Up To Date with patient. Surgery seems to be the last option available. Her sister works at [facility] ([place]) and wants her to talk to orthopedics there. Because patient is uncomfortable with surgery without a second opinion, recommend that she see specialist at [facility] before making a decision. Recheck here as needed.

## 2014-09-12 ENCOUNTER — Encounter: Payer: Self-pay | Admitting: Family Medicine

## 2014-09-12 ENCOUNTER — Ambulatory Visit (INDEPENDENT_AMBULATORY_CARE_PROVIDER_SITE_OTHER): Payer: 59 | Admitting: Family Medicine

## 2014-09-12 VITALS — BP 118/80 | Temp 98.3°F | Ht 66.0 in | Wt 209.4 lb

## 2014-09-12 DIAGNOSIS — L739 Follicular disorder, unspecified: Secondary | ICD-10-CM | POA: Diagnosis not present

## 2014-09-12 MED ORDER — DOXYCYCLINE HYCLATE 100 MG PO TABS: 100.0000 mg | ORAL_TABLET | Freq: Two times a day (BID) | ORAL | Status: DC

## 2014-09-12 NOTE — Progress Notes (Signed)
   Subjective:    Patient ID: Deborah Douglas, female    DOB: Jun 20, 1990, 24 y.o.   MRN: [id number]  Rash This is a new problem. The current episode started in the past 7 days. The problem has been gradually worsening since onset. The affected locations include the left arm and left shoulder. The rash is characterized by redness and pain. She was exposed to nothing. Past treatments include antihistamine (Elocon). The treatment provided no relief.   Patient has just underwent surgery. Wearing a shoulder sling since then. Rash came soon after postop visit.   Patient states that she has no other concerns at this time.   Review of Systems  Skin: Positive for rash.   no headache no chest pain no abdominal pain no change in bowel habits     Objective:   Physical Exam   Alert vitals stable. Lungs clear. Heart regular in rhythm H&T normal left shoulder and fluent erythematous patch of folliculitis. Tender. Central vesicles present     Assessment & Plan:  Impression folliculitis postop mostly in the distribution of sling plan Dr. see twice a day 10 days. Local measures discussed avoid sun. WSL

## 2014-09-13 ENCOUNTER — Telehealth: Payer: Self-pay | Admitting: Family Medicine

## 2014-09-13 NOTE — Telephone Encounter (Signed)
Patient had surgery 08/30/14 on shoulder and had her post op visit 09/08/14 with Dr Onnie Graham. Patient with Folliculitis in this area and wants to know if she should contact ortho to let him know. Patient currently being treated with Doxycycline. Consult with Dr Nicki Reaper- Patient should contact Dr Susie Cassette office and notify them and see if they desire follow up -warning signs discussed and patient verbalized understanding and will contact Dr Susie Cassette office this afternoon with update.

## 2014-09-13 NOTE — Telephone Encounter (Signed)
Patient given Doxycycline for folliculitis on left arm and shoulderyest- note not complete-no culture obtained

## 2014-09-13 NOTE — Telephone Encounter (Signed)
Pt seen 09/12/14 by Dr Richardson Landry   Family wanting to know if you would be calling or letting the  Surgery center know about her folliculitis or possible future staph Infection at that sight?   Or do they need to call them? If so what do they  Need to tell them?   Her Dr there was Dr Onnie Graham out of [place]

## 2014-09-13 NOTE — Telephone Encounter (Signed)
Please find out from the patient who is doing her surgery, what type of surgery they are doing and where they are doing it so that we can send notification to them. Please note that if she has surgery coming up in the immediate future she should contact the surgeon because more than likely they will put it off until she is completely well from the infection plus usually an additional week. Often they will do a nasal swab tests to check for any possibility of MRSA colonization in the nasal passages. If those are found then typically they use a special antibiotics wash on the skin before surgery.

## 2014-09-19 ENCOUNTER — Telehealth: Payer: Self-pay | Admitting: Family Medicine

## 2014-09-19 MED ORDER — CEPHALEXIN 500 MG PO CAPS: 500.0000 mg | ORAL_CAPSULE | Freq: Three times a day (TID) | ORAL | Status: DC

## 2014-09-19 MED ORDER — MOMETASONE FUROATE 0.1 % EX CREA: TOPICAL_CREAM | CUTANEOUS | Status: AC

## 2014-09-19 NOTE — Telephone Encounter (Signed)
Discussed with patient. Patient verbalized understanding. 

## 2014-09-19 NOTE — Telephone Encounter (Signed)
Patient had mometasone (ELOCON) 0.1 % cream called in today for a rash that she has.  She was seen on 09/12/2014 for this.0  She said that this cream actually made it worse last time she tried it.  Please advise.

## 2014-09-19 NOTE — Telephone Encounter (Signed)
One more round of abx and topical steroids  Keflex 500 tid ten d  Elocon cr 60 g bid to affected area

## 2014-09-19 NOTE — Telephone Encounter (Signed)
Patient notified medications have been sent to the pharmacy.

## 2014-09-19 NOTE — Telephone Encounter (Signed)
Pt calling back to say that her rash is still painful, still there an not going away,  Has broke up a little bit in the middle, but the rest still itch really bad. What do you Advise at this point?   Seen 6/23   Issued antibiotic doxycyline   [place] [facility]

## 2014-09-19 NOTE — Telephone Encounter (Signed)
Seen on 09/12/14 and diagnosed with folliculitis

## 2014-09-19 NOTE — Telephone Encounter (Signed)
Just take the oral abx and use topical benadryl cream for hte itching

## 2014-10-24 ENCOUNTER — Other Ambulatory Visit: Payer: Self-pay | Admitting: Occupational Medicine

## 2014-10-24 ENCOUNTER — Ambulatory Visit
Admission: RE | Admit: 2014-10-24 | Discharge: 2014-10-24 | Disposition: A | Discharge status: Home / Self Care | Payer: No Typology Code available for payment source | Source: Ambulatory Visit | Attending: Occupational Medicine | Admitting: Occupational Medicine

## 2014-10-24 DIAGNOSIS — Z021 Encounter for pre-employment examination: Secondary | ICD-10-CM

## 2014-12-05 ENCOUNTER — Encounter: Payer: Self-pay | Admitting: Nurse Practitioner

## 2014-12-05 ENCOUNTER — Ambulatory Visit (INDEPENDENT_AMBULATORY_CARE_PROVIDER_SITE_OTHER): Payer: 59 | Admitting: Nurse Practitioner

## 2014-12-05 VITALS — BP 112/74 | Ht 66.5 in | Wt 203.0 lb

## 2014-12-05 DIAGNOSIS — G43009 Migraine without aura, not intractable, without status migrainosus: Secondary | ICD-10-CM | POA: Insufficient documentation

## 2014-12-05 DIAGNOSIS — D649 Anemia, unspecified: Secondary | ICD-10-CM | POA: Diagnosis not present

## 2014-12-05 LAB — POCT HEMOGLOBIN: Hemoglobin: 13.7 g/dL (ref 12.2–16.2)

## 2014-12-05 MED ORDER — PROPRANOLOL HCL ER 80 MG PO CP24: 80.0000 mg | ORAL_CAPSULE | Freq: Every day | ORAL | Status: DC

## 2014-12-05 NOTE — Progress Notes (Signed)
Subjective:  Presents for recheck on her migraines. Stopped her Topamax due to fuzzy thinking and short-term memory loss. Also was having increased number of headaches. Continues to avoid excessive use of analgesics. Recently her gynecologist switched her to a different pill to try to help her migraines, just started her first pack. Unsure about the name. Continues to have migraines about once a week. Can fade in and out lasting several days, the longest was 7 days. Has increased in intensity. Some photosensitivity. Otherwise no change in symptomatology. Takes daily multivitamin. Noticed increase in headaches over the past 3 weeks. Has not identified any specific trigger. Does have a history of anemia, has not had her hemoglobin checked recently.  Objective:   BP 112/74 mmHg  Ht 5' 6.5" (1.689 m)  Wt 203 lb (92.08 kg)  BMI 32.28 kg/m2 NAD. Alert, oriented. Cheerful affect. Lungs clear. Heart regular rate rhythm. Pulse 84 and regular. Results for orders placed or performed in visit on 12/05/14  POCT hemoglobin  Result Value Ref Range   Hemoglobin 13.7 12.2 - 16.2 g/dL     Assessment:  Problem List Items Addressed This Visit      Cardiovascular and Mediastinum   Migraine without aura and without status migrainosus, not intractable - Primary   Relevant Medications   propranolol ER (INDERAL LA) 80 MG 24 hr capsule    Other Visit Diagnoses    Anemia, unspecified anemia type        Relevant Orders    POCT hemoglobin (Completed)      Plan:  Meds ordered this encounter  Medications  . Norgestim-Eth Estrad Triphasic (TRI-SPRINTEC PO)    Sig: Take by mouth.  . propranolol ER (INDERAL LA) 80 MG 24 hr capsule    Sig: Take 1 capsule (80 mg total) by mouth daily. For migraine prevention    Dispense:  30 capsule    Refill:  2    Order Specific Question:  Supervising Provider    Answer:  Mikey Kirschner [[id number]]   Anemia has resolved. Patient asking about another medicine similar to Topamax  for migraines. A review of migraine treatment shows use of Depakote which has its own set of side effects. Trial of Inderal LA as directed. Patient to call back in a few weeks if no improvement, sooner if worse. Return in about 3 months (around 03/06/2015) for migraine recheck.

## 2015-03-06 ENCOUNTER — Ambulatory Visit: Payer: 59 | Admitting: Nurse Practitioner

## 2015-03-18 ENCOUNTER — Ambulatory Visit (INDEPENDENT_AMBULATORY_CARE_PROVIDER_SITE_OTHER): Payer: 59 | Admitting: Family Medicine

## 2015-03-18 ENCOUNTER — Encounter: Payer: Self-pay | Admitting: Family Medicine

## 2015-03-18 VITALS — BP 122/76 | Ht 66.5 in | Wt 213.0 lb

## 2015-03-18 DIAGNOSIS — S5401XA Injury of ulnar nerve at forearm level, right arm, initial encounter: Secondary | ICD-10-CM | POA: Diagnosis not present

## 2015-03-18 NOTE — Progress Notes (Signed)
   Subjective:    Patient ID: Deborah Douglas, female    DOB: 1990-05-20, 24 y.o.   MRN: [id number]  HPI Patient arrives with c/o right elbow pain since hitting elbow the first part of December- a tingling pain.  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  Review of Systems  patient was subjective discomfort in the elbow with full flexion with subjective numbness and tingling in the ulnar nerve distribution. No weakness.    Objective:   Physical Exam  normal sensation. Normal reflexes. Normal strength of the hands normal range of motion of the forearm and wrist.       Assessment & Plan:   all nerve contusion-this is causing neuropraxia. Has good strength. May use anti-inflammatory over the next 10-14 days. Avoid excessive flexing of the joint. Should gradually heal up over the next 4-6 weeks.

## 2015-03-20 ENCOUNTER — Ambulatory Visit
Admission: RE | Admit: 2015-03-20 | Discharge: 2015-03-20 | Disposition: A | Discharge status: Home / Self Care | Payer: Worker's Compensation | Source: Ambulatory Visit | Attending: Nurse Practitioner | Admitting: Nurse Practitioner

## 2015-03-20 ENCOUNTER — Other Ambulatory Visit: Payer: Self-pay | Admitting: Nurse Practitioner

## 2015-03-20 DIAGNOSIS — R52 Pain, unspecified: Secondary | ICD-10-CM

## 2015-03-20 DIAGNOSIS — T1490XA Injury, unspecified, initial encounter: Secondary | ICD-10-CM

## 2015-05-19 ENCOUNTER — Other Ambulatory Visit (INDEPENDENT_AMBULATORY_CARE_PROVIDER_SITE_OTHER): Payer: Self-pay | Admitting: Internal Medicine

## 2015-07-09 ENCOUNTER — Other Ambulatory Visit: Payer: Self-pay | Admitting: Nurse Practitioner

## 2015-07-09 ENCOUNTER — Other Ambulatory Visit: Payer: Self-pay | Admitting: Family Medicine

## 2016-03-24 DIAGNOSIS — Z01419 Encounter for gynecological examination (general) (routine) without abnormal findings: Secondary | ICD-10-CM | POA: Diagnosis not present

## 2016-04-10 ENCOUNTER — Emergency Department (HOSPITAL_COMMUNITY): Payer: Commercial Managed Care - HMO

## 2016-04-10 ENCOUNTER — Encounter (HOSPITAL_COMMUNITY): Payer: Self-pay | Admitting: Emergency Medicine

## 2016-04-10 ENCOUNTER — Emergency Department (HOSPITAL_COMMUNITY)
Admission: EM | Admit: 2016-04-10 | Discharge: 2016-04-10 | Disposition: A | Discharge status: Home / Self Care | Payer: Commercial Managed Care - HMO | Attending: Emergency Medicine | Admitting: Emergency Medicine

## 2016-04-10 DIAGNOSIS — R0781 Pleurodynia: Secondary | ICD-10-CM | POA: Diagnosis not present

## 2016-04-10 DIAGNOSIS — S99912A Unspecified injury of left ankle, initial encounter: Secondary | ICD-10-CM | POA: Diagnosis not present

## 2016-04-10 DIAGNOSIS — M25561 Pain in right knee: Secondary | ICD-10-CM | POA: Insufficient documentation

## 2016-04-10 DIAGNOSIS — Y9389 Activity, other specified: Secondary | ICD-10-CM | POA: Insufficient documentation

## 2016-04-10 DIAGNOSIS — Z791 Long term (current) use of non-steroidal anti-inflammatories (NSAID): Secondary | ICD-10-CM | POA: Insufficient documentation

## 2016-04-10 DIAGNOSIS — Y999 Unspecified external cause status: Secondary | ICD-10-CM | POA: Insufficient documentation

## 2016-04-10 DIAGNOSIS — M25572 Pain in left ankle and joints of left foot: Secondary | ICD-10-CM | POA: Diagnosis not present

## 2016-04-10 DIAGNOSIS — S299XXA Unspecified injury of thorax, initial encounter: Secondary | ICD-10-CM | POA: Diagnosis not present

## 2016-04-10 DIAGNOSIS — M25511 Pain in right shoulder: Secondary | ICD-10-CM | POA: Diagnosis not present

## 2016-04-10 DIAGNOSIS — Y92410 Unspecified street and highway as the place of occurrence of the external cause: Secondary | ICD-10-CM | POA: Diagnosis not present

## 2016-04-10 MED ORDER — ALBUTEROL SULFATE HFA 108 (90 BASE) MCG/ACT IN AERS
2.0000 | INHALATION_SPRAY | Freq: Once | RESPIRATORY_TRACT | Status: DC
  Filled 2016-04-10: qty 6.7

## 2016-04-10 MED ORDER — CYCLOBENZAPRINE HCL 10 MG PO TABS: 10.0000 mg | ORAL_TABLET | Freq: Two times a day (BID) | ORAL | 0 refills | Status: DC | PRN
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
## 2016-04-10 NOTE — Discharge Instructions (Signed)
All x-rays were normal. You'll be sore for several days. Ice pack, ankle brace, Tylenol or ibuprofen. Prescription for muscle relaxer.

## 2016-04-12 ENCOUNTER — Encounter: Payer: Self-pay | Admitting: Family Medicine

## 2016-04-12 ENCOUNTER — Ambulatory Visit (INDEPENDENT_AMBULATORY_CARE_PROVIDER_SITE_OTHER): Payer: 59 | Admitting: Family Medicine

## 2016-04-12 VITALS — BP 116/76 | Ht 66.5 in | Wt 212.8 lb

## 2016-04-12 DIAGNOSIS — S93492A Sprain of other ligament of left ankle, initial encounter: Secondary | ICD-10-CM | POA: Diagnosis not present
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
## 2016-04-29 DIAGNOSIS — L700 Acne vulgaris: Secondary | ICD-10-CM | POA: Diagnosis not present

## 2016-06-09 ENCOUNTER — Encounter: Payer: Self-pay | Admitting: Nurse Practitioner

## 2016-06-09 ENCOUNTER — Ambulatory Visit (INDEPENDENT_AMBULATORY_CARE_PROVIDER_SITE_OTHER): Payer: Commercial Managed Care - HMO | Admitting: Nurse Practitioner

## 2016-06-09 VITALS — BP 112/74 | Temp 98.1°F | Ht 66.5 in | Wt 212.2 lb

## 2016-06-09 DIAGNOSIS — L509 Urticaria, unspecified: Secondary | ICD-10-CM

## 2016-06-09 DIAGNOSIS — J300 Vasomotor rhinitis: Secondary | ICD-10-CM | POA: Diagnosis not present

## 2016-06-09 DIAGNOSIS — L508 Other urticaria: Secondary | ICD-10-CM | POA: Insufficient documentation

## 2016-06-09 MED ORDER — RIZATRIPTAN BENZOATE 10 MG PO TBDP: ORAL_TABLET | ORAL | 0 refills | Status: DC

## 2016-06-09 MED ORDER — NAPROXEN 500 MG PO TABS: ORAL_TABLET | ORAL | 0 refills | Status: DC
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
## 2016-06-09 NOTE — Patient Instructions (Addendum)
Allegra in the morning Benadryl at night Pepcid as directed Nasacort AQ as directed

## 2016-06-23 ENCOUNTER — Encounter: Payer: Self-pay | Admitting: Nurse Practitioner

## 2016-06-24 ENCOUNTER — Other Ambulatory Visit: Payer: Self-pay | Admitting: Nurse Practitioner

## 2016-06-24 DIAGNOSIS — L509 Urticaria, unspecified: Secondary | ICD-10-CM

## 2016-06-30 ENCOUNTER — Telehealth: Payer: Self-pay | Admitting: Family Medicine

## 2016-06-30 ENCOUNTER — Encounter: Payer: Self-pay | Admitting: Nurse Practitioner

## 2016-06-30 MED ORDER — AMOXICILLIN-POT CLAVULANATE 875-125 MG PO TABS: 1.0000 | ORAL_TABLET | Freq: Two times a day (BID) | ORAL | 0 refills | Status: AC

## 2016-06-30 NOTE — Telephone Encounter (Signed)
Patient was seen on 06/09/16 by Hoyle Sauer.  She said Hoyle Sauer told her to call her if she started having issues with her ears.  She said she has a really bad pain in ear, down jaw and up forehead.  She said Hoyle Sauer told her we would call something in.   [employer]

## 2016-06-30 NOTE — Telephone Encounter (Signed)
Consult with Dr Richardson Landry. Dr Richardson Landry recommends Augmentin 875mg  #20 one twice a day for 10 days. Prescription sent electronically to pharmacy. Patient notified.

## 2016-07-06 ENCOUNTER — Other Ambulatory Visit (INDEPENDENT_AMBULATORY_CARE_PROVIDER_SITE_OTHER): Payer: Self-pay | Admitting: Internal Medicine

## 2016-07-06 ENCOUNTER — Encounter: Payer: Self-pay | Admitting: Family Medicine

## 2016-08-20 ENCOUNTER — Encounter: Payer: Self-pay | Admitting: Nurse Practitioner

## 2016-08-20 ENCOUNTER — Other Ambulatory Visit: Payer: Self-pay | Admitting: Nurse Practitioner

## 2016-08-20 MED ORDER — NITROFURANTOIN MONOHYD MACRO 100 MG PO CAPS: 100.0000 mg | ORAL_CAPSULE | Freq: Two times a day (BID) | ORAL | 0 refills | Status: DC

## 2016-08-24 ENCOUNTER — Encounter: Payer: Self-pay | Admitting: Allergy & Immunology

## 2016-08-24 ENCOUNTER — Ambulatory Visit (INDEPENDENT_AMBULATORY_CARE_PROVIDER_SITE_OTHER): Payer: Commercial Managed Care - HMO | Admitting: Allergy & Immunology

## 2016-08-24 VITALS — BP 112/76 | HR 80 | Temp 98.1°F | Resp 12 | Ht 65.55 in | Wt 209.8 lb

## 2016-08-24 DIAGNOSIS — J310 Chronic rhinitis: Secondary | ICD-10-CM | POA: Diagnosis not present

## 2016-08-24 DIAGNOSIS — L508 Other urticaria: Secondary | ICD-10-CM

## 2016-08-24 DIAGNOSIS — T781XXD Other adverse food reactions, not elsewhere classified, subsequent encounter: Secondary | ICD-10-CM

## 2016-08-24 LAB — CBC WITH DIFFERENTIAL/PLATELET
Basophils Absolute: 0 cells/uL (ref 0–200)
Basophils Relative: 0 %
EOS ABS: 270 {cells}/uL (ref 15–500)
Eosinophils Relative: 3 %
HEMATOCRIT: 41.9 % (ref 35.0–45.0)
Hemoglobin: 13.8 g/dL (ref 11.7–15.5)
LYMPHS PCT: 36 %
Lymphs Abs: 3240 cells/uL (ref 850–3900)
MCH: 28.2 pg (ref 27.0–33.0)
MCHC: 32.9 g/dL (ref 32.0–36.0)
MCV: 85.5 fL (ref 80.0–100.0)
MONO ABS: 360 {cells}/uL (ref 200–950)
MONOS PCT: 4 %
MPV: 9.3 fL (ref 7.5–12.5)
NEUTROS PCT: 57 %
Neutro Abs: 5130 cells/uL (ref 1500–7800)
Platelets: 298 10*3/uL (ref 140–400)
RBC: 4.9 MIL/uL (ref 3.80–5.10)
RDW: 14 % (ref 11.0–15.0)
WBC: 9 10*3/uL (ref 3.8–10.8)

## 2016-08-24 NOTE — Patient Instructions (Addendum)
1. Chronic urticaria - Your history does not have any "red flags" such as fevers, joint pains, or permanent skin changes that would be concerning for a more serious cause of hives.  - We will get some labs to rule out serious causes of hives: environmental allergy level complete blood count, tryptase level, chronic urticaria panel, CMP, ESR, and CRP. - We will get testing for soy IgE to see if you have an allergy to this.  - Chronic hives are often times a self limited process and will "burn themselves out" over 6-12 months, although this is not always the case.  - In the meantime, start suppressive dosing of antihistamines:   - Evening: Allegra (fexofenadine) 338m (two tablets) OR Xyzal (levocetirizine) 120m(two tablets)  - Morning: Zyrtec (cetirizine) 2039mtwo tablets)  - If the above is not working, try adding: Zantac (ranitidine) 300m41mwo tablets) - You can change this dosing at home, decreasing the dose as needed or increasing the dosing as needed.  - If you are not tolerating the medications or are tired of taking them every day, we can start treatment with a monthly injectable medication called Xolair.   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 - We ordered an environmental allergy panel to see if there are any allergic triggers.   3. Return in about 2 months (around 10/24/2016).  Please inform us [place] Emergency Department visits, hospitalizations, or changes in symptoms. Call us [place] going to the ED for breathing or allergy symptoms since we might be able to fit you in for a sick visit. Feel free to contact us a[place]ime with any questions, problems, or concerns.  It was a pleasure to meet you today! Happy summer!   Websites that have reliable patient information: 1. American Academy of Asthma, Allergy, and Immunology: www.aaaai.org 2. Food Allergy Research and Education (FARE): foodallergy.org 3. Mothers of Asthmatics:  http://www.asthmacommunitynetwork.org 4. American College of Allergy, Asthma, and Immunology: www.acaai.org

## 2016-08-24 NOTE — Progress Notes (Signed)
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 Chronic urticaria - Your history does not have any "red flags" such as fevers, joint pains, or permanent skin changes that would be concerning for a more serious cause of hives.  - We will get some labs to rule out serious causes of hives: environmental allergy level complete blood count, tryptase level, chronic urticaria panel, CMP, ESR, and CRP. - We will get testing for soy IgE to see if you have an allergy to this.  - Chronic hives are often times a self limited process and will "burn themselves out" over 6-12 months, although this is not always the case.  - In the meantime, start suppressive dosing of antihistamines:   - Evening: Allegra (fexofenadine) 387m (two tablets) OR Xyzal (levocetirizine) 144m(two tablets)  - Morning: Zyrtec (cetirizine) 2076mtwo tablets)  - If the above is not working, try adding: Zantac (ranitidine) 300m67mwo tablets) - You can change this dosing at home, decreasing the dose as needed or increasing the dosing as needed.  - If you are not tolerating the medications or are tired of taking them every day, we can start treatment with a monthly injectable medication called Xolair.   - Information on Xolair provided to the patient in case she decides to go that route later.   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 - We ordered an environmental allergy panel to see if there are any allergic triggers.   3. Adverse food reaction - We will get testing for soy IgE. - Otherwise there is no indication  for food testing at this time.   4. Return in about 2 months (around 10/24/2016).  Subjective:   TaylKALAYSIA DEMONBREUNa 26 y62. female presenting today for evaluation of  Chief Complaint  Patient presents with  . Urticaria    3 months    TaylBATOOL MAJID a history of the following: Patient Active Problem List   Diagnosis Date Noted  . Urticaria 06/09/2016  . Migraine without aura and without status migrainosus, not intractable 12/05/2014  . Labral tear of shoulder 08/23/2014  . Migraine 05/30/2014  . IBS (irritable bowel syndrome) 12/06/2013  . H/O iron deficiency 12/06/2013  . RUQ abdominal pain 03/20/2012  . GERD (gastroesophageal reflux disease) 05/25/2011  . Epigastric pain 05/25/2011  . FIBROMA 09/04/2008    History obtained from: chart review and patient.  TaylRogelio Seen referred by LukiKathyrn Drown.     TaylKimberlynna 26 y46. female presenting for an evaluation of urticaria. They started 3-4 months ago. She was hiving extreme pruritis. She went to the dermatologist and was diagnosed as acne. This is predominantly on her back. Then she went back to her PCP who said it was definitely hives. She was started on Allegra. She is taking one tablet twice daily. She will have break through symptoms when she does not take it routinely. There is no corresponding worsening with foods. She does have dogs at home and these are not new at all. She has no systemic problems at all. These do wake her up  in the middle in the middle of the night.   She denies fever and joint pains. Normal skin is left upon resolution of symptoms. She did have a reaction to soy milk when she was in college. She drank some soy milk when she was getting off of her cow's milk from gallbladder. She actually developed mouth tingling and itching with the soy milk. She does occasionally eat dairy however.   Kathlyn has no history of seasonal allergies until this year. She did develop sneezing which started around the  same time as the hives. She does rhinorrhea throughout the year, but no nasal congestion. She was seen by ENT and was diagnosed with gustatory rhinitis. She was started on a nose spray which she does not use routinely. She saw him around October 2017. She did have a nasal endoscopy performed which was normal.   She does have a history of migraines. She uses Maxalt as needed as well as naproxen. She also has a history of polycystic ovarian syndrome. She has been a Engineer, structural for 1.5 years. Interestingly she actually was interested in becoming a trauma surgeon but then changed her mind about this after taking a class in college. She lives in [place] but works in [employer].   Otherwise, there is no history of other atopic diseases, including asthma, drug allergies, food allergies, or stinging insect allergies. There is no significant infectious history. Vaccinations are up to date.    Past Medical History: Patient Active Problem List   Diagnosis Date Noted  . Urticaria 06/09/2016  . Migraine without aura and without status migrainosus, not intractable 12/05/2014  . Labral tear of shoulder 08/23/2014  . Migraine 05/30/2014  . IBS (irritable bowel syndrome) 12/06/2013  . H/O iron deficiency 12/06/2013  . RUQ abdominal pain 03/20/2012  . GERD (gastroesophageal reflux disease) 05/25/2011  . Epigastric pain 05/25/2011  . 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 Always use your most recent med list.          fexofenadine 180 MG tablet Commonly known as:  ALLEGRA Take 180 mg by mouth daily.   naproxen 500 MG tablet Commonly known as:  NAPROSYN TAKE (1) TABLET BY MOUTH TWICE DAILY AFTER MEALS PRN HEADACHE OR PAIN   nitrofurantoin (macrocrystal-monohydrate) 100 MG capsule Commonly known as:  MACROBID Take 1 capsule (100 mg  total) by mouth 2 (two) times daily.   rizatriptan 10 MG disintegrating tablet Commonly known as:  MAXALT-MLT TAKE 1 TABLET AT ONSET OF HEADACHE; MAY REPEAT IN 2 HOURS. NO MORE THAN 2 TABS/24 HOURS.       Birth History: non-contributory. Born at term without complications.   Developmental History: non-contributory.   Past Surgical History: Past Surgical History:  Procedure Laterality Date  . ADENOIDECTOMY    . ESOPHAGOGASTRODUODENOSCOPY  05/26/2011   Procedure: ESOPHAGOGASTRODUODENOSCOPY (EGD);  Surgeon: Rogene Houston, MD;  [facility]: [facility];  Service: Endoscopy;  Laterality: N/A;  300  . KNEE ARTHROSCOPY    . TONSILLECTOMY    . TONSILLECTOMY AND ADENOIDECTOMY       Family History: Family History  Problem Relation Age of Onset  . GER disease Mother   . Irritable bowel syndrome Mother   . Addison's disease Mother   . Allergic rhinitis Mother   .  GER disease Father   . Healthy Sister   . Healthy Sister   . Allergic rhinitis Sister   . Allergic rhinitis Maternal Grandmother   . Angioedema Neg Hx   . Asthma Neg Hx   . Eczema Neg Hx   . Immunodeficiency Neg Hx   . Urticaria Neg Hx      Social History: Jerika lives at home with her family. She has two dogs. There is no smoking at home. She works as a Engineer, structural on the night shift.      Review of Systems: a 14-point review of systems is pertinent for what is mentioned in HPI.  Otherwise, all other systems were negative. Constitutional: negative other than that listed in the HPI Eyes: negative other than that listed in the HPI Ears, nose, mouth, throat, and face: negative other than that listed in the HPI Respiratory: negative other than that listed in the HPI Cardiovascular: negative other than that listed in the HPI Gastrointestinal: negative other than that listed in the HPI Genitourinary: negative other than that listed in the HPI Integument: negative other than that listed in the HPI Hematologic:  negative other than that listed in the HPI Musculoskeletal: negative other than that listed in the HPI Neurological: negative other than that listed in the HPI Allergy/Immunologic: negative other than that listed in the HPI    Objective:   Blood pressure 112/76, pulse 80, temperature 98.1 F (36.7 C), temperature source Oral, resp. rate 12, height 5' 5.55" (1.665 m), weight 209 lb 12.8 oz (95.2 kg), SpO2 98 %. Body mass index is 34.33 kg/m.   Physical Exam:  General: Alert, interactive, in no acute distress. Cooperative with the exam.  Eyes: No conjunctival injection present on the right, No conjunctival injection present on the left, PERRL bilaterally, No discharge on the right, No discharge on the left and No Horner-Trantas dots present Ears: Right TM pearly gray with normal light reflex, Left TM pearly gray with normal light reflex, Right TM intact without perforation and Left TM intact without perforation.  Nose/Throat: External nose within normal limits and septum midline, turbinates edematous without discharge, post-pharynx mildly erythematous without cobblestoning in the posterior oropharynx. Tonsils 2+ without exudates Neck: Supple without thyromegaly.  Adenopathy: no enlarged lymph nodes appreciated in the anterior cervical, occipital, axillary, epitrochlear, inguinal, or popliteal regions Lungs: Clear to auscultation without wheezing, rhonchi or rales. No increased work of breathing. CV: Normal S1/S2, no murmurs. Capillary refill <2 seconds.  Abdomen: Nondistended, nontender. No guarding or rebound tenderness. Bowel sounds present in all fields and hypoactive  Skin: Scattered erythematous urticarial type lesions primarily located back as well as the bilateral arms and upper chest , nonvesicular. Extremities:  No clubbing, cyanosis or edema. Neuro:   Grossly intact. No focal deficits appreciated. Responsive to questions.  Diagnostic studies: none (deferred due to urticarial  clinical state)       Salvatore Marvel, MD [facility]

## 2016-08-25 LAB — COMPLETE METABOLIC PANEL WITH GFR
ALBUMIN: 4.1 g/dL (ref 3.6–5.1)
ALK PHOS: 52 U/L (ref 33–115)
ALT: 64 U/L — ABNORMAL HIGH (ref 6–29)
AST: 137 U/L — AB (ref 10–30)
BILIRUBIN TOTAL: 0.4 mg/dL (ref 0.2–1.2)
BUN: 9 mg/dL (ref 7–25)
CO2: 24 mmol/L (ref 20–31)
Calcium: 9.4 mg/dL (ref 8.6–10.2)
Chloride: 106 mmol/L (ref 98–110)
Creat: 0.77 mg/dL (ref 0.50–1.10)
GFR, Est African American: >89 mL/min (ref >=60)
GLUCOSE: 79 mg/dL (ref 65–99)
Potassium: 4.1 mmol/L (ref 3.5–5.3)
SODIUM: 140 mmol/L (ref 135–146)
TOTAL PROTEIN: 6.9 g/dL (ref 6.1–8.1)

## 2016-08-25 LAB — CP584 ZONE 3
Allergen, Black Locust, Acacia9: <0.1 kU/L
Allergen, Comm Silver Birch, t9: <0.1 kU/L
Allergen, D pternoyssinus,d7: 0.7 kU/L — ABNORMAL HIGH
Allergen, Mucor Racemosus, M4: <0.1 kU/L
Bermuda Grass: <0.1 kU/L
Box Elder IgE: <0.1 kU/L
Cockroach: <0.1 kU/L
Common Ragweed: <0.1 kU/L
D. farinae: 0.82 kU/L — ABNORMAL HIGH
Nettle: <0.1 kU/L
Rough Pigweed  IgE: <0.1 kU/L

## 2016-08-25 LAB — SEDIMENTATION RATE: Sed Rate: 7 mm/hr (ref 0–20)

## 2016-08-25 LAB — ANA: Anti Nuclear Antibody(ANA): NEGATIVE

## 2016-08-25 LAB — ALLERGEN SOYBEAN: Soybean IgE: <0.1 kU/L

## 2016-08-25 LAB — C-REACTIVE PROTEIN: CRP: 5.4 mg/L (ref <8.0)

## 2016-08-27 LAB — TRYPTASE: Tryptase: 1.5 ug/L (ref <11)

## 2016-09-01 LAB — CP CHRONIC URTICARIA INDEX PANEL
Histamine Release: <16 % (ref <16)
THYROGLOBULIN AB: 45 [IU]/mL — AB (ref <2)
TSH: 2.59 mIU/L
Thyroperoxidase Ab SerPl-aCnc: >900 IU/mL — ABNORMAL HIGH (ref <9)

## 2016-09-10 ENCOUNTER — Encounter: Payer: Self-pay | Admitting: Nurse Practitioner

## 2016-09-10 ENCOUNTER — Ambulatory Visit (INDEPENDENT_AMBULATORY_CARE_PROVIDER_SITE_OTHER): Payer: Commercial Managed Care - HMO | Admitting: Nurse Practitioner

## 2016-09-10 VITALS — BP 118/78 | Ht 66.5 in | Wt 212.0 lb

## 2016-09-10 DIAGNOSIS — R748 Abnormal levels of other serum enzymes: Secondary | ICD-10-CM

## 2016-09-10 DIAGNOSIS — R760 Raised antibody titer: Secondary | ICD-10-CM | POA: Diagnosis not present

## 2016-09-10 DIAGNOSIS — E282 Polycystic ovarian syndrome: Secondary | ICD-10-CM | POA: Diagnosis not present

## 2016-09-10 DIAGNOSIS — R768 Other specified abnormal immunological findings in serum: Secondary | ICD-10-CM

## 2016-09-10 MED ORDER — NORETHINDRONE 0.35 MG PO TABS: 1.0000 | ORAL_TABLET | Freq: Every day | ORAL | 11 refills | Status: DC

## 2016-09-10 NOTE — Patient Instructions (Signed)
Repeat TSH in 6 months

## 2016-09-10 NOTE — Progress Notes (Signed)
Subjective:  Presents to discuss her recent lab work done through her allergist. Has questions about her liver test and her thyroid antibodies. Hives have slightly improved. Has PCOS, no cycle for the past 6 months. Stopped her birth control pills since she was unsure whether this was contributing to her hives. No difficulty swallowing. Rare mild right upper quadrant discomfort at times, no severe pain. No alcohol use. No Tylenol use.  Objective:   BP 118/78   Ht 5' 6.5" (1.689 m)   Wt 212 lb 0.2 oz (96.2 kg)   BMI 33.71 kg/m  NAD. Alert, oriented. Lungs clear. Heart regular rate rhythm. Thyroid nontender to palpation, no mass or goiter noted. On 08/24/2016 her ALT 64 and AST 137. Thyroid antibodies significantly elevated but normal TSH. Abdomen soft nondistended nontender without obvious hepatomegaly.   Assessment:   Problem List Items Addressed This Visit      Endocrine   PCOS (polycystic ovarian syndrome)     Other   Abnormal liver enzymes - Primary   Relevant Orders   Hepatic function panel   Thyroid antibody positive       Plan:   Meds ordered this encounter  Medications  . norethindrone (MICRONOR,CAMILA,ERRIN) 0.35 MG tablet    Sig: Take 1 tablet (0.35 mg total) by mouth daily.    Dispense:  1 Package    Refill:  11    Order Specific Question:   Supervising Provider    Answer:   Maggie Font   Agree with plan to recheck TSH every 6 months but with liver enzymes significantly elevated, recommend recheck in 2 weeks which will be one month after previous exams. Further follow-up based on results. Discussed options to help with her PCOS and cycles, patient wishes to start progesterone only pill. Take as directed. DC if any hives or itching. Warning signs reviewed. Call back if any further problems, otherwise routine follow-up with allergy specialist.

## 2016-09-27 ENCOUNTER — Other Ambulatory Visit: Payer: Self-pay | Admitting: Nurse Practitioner

## 2016-09-27 LAB — HEPATIC FUNCTION PANEL
ALBUMIN: 4 g/dL (ref 3.6–5.1)
ALT: 28 U/L (ref 6–29)
AST: 17 U/L (ref 10–30)
Alkaline Phosphatase: 66 U/L (ref 33–115)
Bilirubin, Direct: 0 mg/dL (ref <=0.2)
Indirect Bilirubin: 0.2 mg/dL (ref 0.2–1.2)
TOTAL PROTEIN: 6.7 g/dL (ref 6.1–8.1)
Total Bilirubin: 0.2 mg/dL (ref 0.2–1.2)

## 2016-10-26 ENCOUNTER — Ambulatory Visit: Payer: Commercial Managed Care - HMO | Admitting: Allergy & Immunology

## 2016-12-15 DIAGNOSIS — G43909 Migraine, unspecified, not intractable, without status migrainosus: Secondary | ICD-10-CM | POA: Diagnosis not present

## 2017-07-12 DIAGNOSIS — Z01419 Encounter for gynecological examination (general) (routine) without abnormal findings: Secondary | ICD-10-CM | POA: Diagnosis not present

## 2017-09-28 ENCOUNTER — Encounter: Payer: Self-pay | Admitting: Family Medicine

## 2017-09-28 ENCOUNTER — Ambulatory Visit: Payer: 59 | Admitting: Nurse Practitioner

## 2017-09-28 ENCOUNTER — Encounter: Payer: Self-pay | Admitting: Nurse Practitioner

## 2017-09-28 VITALS — BP 118/72 | Temp 98.2°F | Ht 66.5 in | Wt 213.4 lb

## 2017-09-28 DIAGNOSIS — R197 Diarrhea, unspecified: Secondary | ICD-10-CM

## 2017-09-28 DIAGNOSIS — B9689 Other specified bacterial agents as the cause of diseases classified elsewhere: Secondary | ICD-10-CM

## 2017-09-28 DIAGNOSIS — J069 Acute upper respiratory infection, unspecified: Secondary | ICD-10-CM

## 2017-09-28 DIAGNOSIS — J209 Acute bronchitis, unspecified: Secondary | ICD-10-CM | POA: Diagnosis not present

## 2017-09-28 MED ORDER — BENZONATATE 100 MG PO CAPS: 100.0000 mg | ORAL_CAPSULE | Freq: Three times a day (TID) | ORAL | 0 refills | Status: DC | PRN

## 2017-09-28 MED ORDER — LEVOFLOXACIN 500 MG PO TABS: 500.0000 mg | ORAL_TABLET | Freq: Every day | ORAL | 0 refills | Status: DC

## 2017-09-28 MED ORDER — ALBUTEROL SULFATE HFA 108 (90 BASE) MCG/ACT IN AERS: 2.0000 | INHALATION_SPRAY | RESPIRATORY_TRACT | 0 refills | Status: DC | PRN

## 2017-09-28 NOTE — Patient Instructions (Signed)
activia yogurt 2 cups per day [employer]

## 2017-09-30 ENCOUNTER — Encounter: Payer: Self-pay | Admitting: Nurse Practitioner

## 2017-09-30 NOTE — Progress Notes (Signed)
Subjective: Presents for complaints of sore throat bad cough ear pain facial area headache and dizziness that began on 7/5.  Chills with possible fever.  Runny nose.  Worsening cough especially at nighttime, nonproductive.  Has had some mild wheezing for the past 2 days.  Bilateral ear pain.  Non-smoker.  Denies any possibility of pregnancy.  Having loose stools after eating, no blood in her stool.  No abdominal pain.  No vomiting.  Taking fluids well.  Voiding normal limit.  Objective:   BP 118/72   Temp 98.2 F (36.8 C) (Oral)   Ht 5' 6.5" (1.689 m)   Wt 213 lb 6.4 oz (96.8 kg)   BMI 33.93 kg/m  NAD.  Alert, oriented.  TMs retracted, no erythema.  Pharynx injected with PND noted.  Neck supple with mild soft anterior adenopathy.  Lungs scattered faint coarse rhonchi, no wheezing or tachypnea.  Frequent bronchitic cough.  Heart regular rate rhythm.  Abdomen soft nondistended with mild lower abdominal tenderness more on the left side.  No rebound or guarding.  No obvious masses.  Assessment:  Bacterial upper respiratory infection  Acute bronchitis, unspecified organism  Diarrhea in adult patient    Plan:   Meds ordered this encounter  Medications  . levofloxacin (LEVAQUIN) 500 MG tablet    Sig: Take 1 tablet (500 mg total) by mouth daily.    Dispense:  10 tablet    Refill:  0    Order Specific Question:   Supervising Provider    Answer:   Mikey Kirschner [[id number]]  . benzonatate (TESSALON) 100 MG capsule    Sig: Take 1 capsule (100 mg total) by mouth 3 (three) times daily as needed for cough.    Dispense:  30 capsule    Refill:  0    Order Specific Question:   Supervising Provider    Answer:   Mikey Kirschner [[id number]]  . albuterol (PROVENTIL HFA;VENTOLIN HFA) 108 (90 Base) MCG/ACT inhaler    Sig: Inhale 2 puffs into the lungs every 4 (four) hours as needed.    Dispense:  1 Inhaler    Refill:  0    Order Specific Question:   Supervising Provider    Answer:   Mikey Kirschner  [[id number]]   Use albuterol as needed as directed for wheezing.  Recommend she use it before going to bed at night.  Start activia yogurt 2 cups/day.  OTC meds as directed for congestion and cough.  Warning signs reviewed.  Call back in 48 hours if no improvement, sooner if worse.

## 2017-10-06 DIAGNOSIS — L0390 Cellulitis, unspecified: Secondary | ICD-10-CM | POA: Diagnosis not present

## 2017-10-06 DIAGNOSIS — N644 Mastodynia: Secondary | ICD-10-CM | POA: Diagnosis not present

## 2018-07-13 DIAGNOSIS — N6320 Unspecified lump in the left breast, unspecified quadrant: Secondary | ICD-10-CM | POA: Diagnosis not present

## 2018-07-19 ENCOUNTER — Other Ambulatory Visit: Payer: Self-pay

## 2018-07-19 ENCOUNTER — Emergency Department (HOSPITAL_COMMUNITY)
Admission: EM | Admit: 2018-07-19 | Discharge: 2018-07-19 | Disposition: A | Discharge status: Home / Self Care | Payer: No Typology Code available for payment source | Attending: Emergency Medicine | Admitting: Emergency Medicine

## 2018-07-19 ENCOUNTER — Encounter (HOSPITAL_COMMUNITY): Payer: Self-pay | Admitting: Emergency Medicine

## 2018-07-19 DIAGNOSIS — Y990 Civilian activity done for income or pay: Secondary | ICD-10-CM | POA: Insufficient documentation

## 2018-07-19 DIAGNOSIS — Z7721 Contact with and (suspected) exposure to potentially hazardous body fluids: Secondary | ICD-10-CM | POA: Insufficient documentation

## 2018-07-19 DIAGNOSIS — Z79899 Other long term (current) drug therapy: Secondary | ICD-10-CM | POA: Insufficient documentation

## 2018-07-19 LAB — HIV ANTIBODY (ROUTINE TESTING W REFLEX): HIV Screen 4th Generation wRfx: NONREACTIVE

## 2018-07-19 NOTE — Discharge Instructions (Signed)
Your results should be available in the next 48-72 hours Make sure to keep your contacts cleaned-- I would discard the current ones and open a fresh pair if possible.
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
## 2018-07-20 LAB — HEPATITIS PANEL, ACUTE
HCV Ab: <0.1 s/co ratio (ref 0.0–0.9)
Hep A IgM: NEGATIVE
Hep B C IgM: NEGATIVE
Hepatitis B Surface Ag: NEGATIVE

## 2018-07-31 DIAGNOSIS — N6320 Unspecified lump in the left breast, unspecified quadrant: Secondary | ICD-10-CM | POA: Diagnosis not present

## 2018-07-31 DIAGNOSIS — Z01419 Encounter for gynecological examination (general) (routine) without abnormal findings: Secondary | ICD-10-CM | POA: Diagnosis not present

## 2018-07-31 DIAGNOSIS — N912 Amenorrhea, unspecified: Secondary | ICD-10-CM | POA: Diagnosis not present

## 2018-07-31 DIAGNOSIS — E282 Polycystic ovarian syndrome: Secondary | ICD-10-CM | POA: Diagnosis not present

## 2018-10-18 ENCOUNTER — Ambulatory Visit: Payer: 59 | Admitting: Podiatry

## 2018-10-18 ENCOUNTER — Ambulatory Visit (INDEPENDENT_AMBULATORY_CARE_PROVIDER_SITE_OTHER): Payer: 59

## 2018-10-18 ENCOUNTER — Other Ambulatory Visit: Payer: Self-pay | Admitting: Podiatry

## 2018-10-18 ENCOUNTER — Encounter: Payer: Self-pay | Admitting: Podiatry

## 2018-10-18 ENCOUNTER — Other Ambulatory Visit: Payer: Self-pay

## 2018-10-18 VITALS — BP 112/77 | HR 83 | Temp 98.3°F | Resp 16

## 2018-10-18 DIAGNOSIS — M722 Plantar fascial fibromatosis: Secondary | ICD-10-CM

## 2018-10-18 DIAGNOSIS — M79671 Pain in right foot: Secondary | ICD-10-CM | POA: Diagnosis not present

## 2018-10-18 MED ORDER — DICLOFENAC SODIUM 75 MG PO TBEC: 75.0000 mg | DELAYED_RELEASE_TABLET | Freq: Two times a day (BID) | ORAL | 2 refills | Status: DC

## 2018-10-18 NOTE — Progress Notes (Signed)
   Subjective:    Patient ID: Deborah Douglas, female    DOB: [birth year], 28 y.o.   MRN: [id number]  HPI    Review of Systems  All other systems reviewed and are negative.      Objective:   Physical Exam        Assessment & Plan:

## 2018-10-18 NOTE — Patient Instructions (Signed)

## 2018-10-19 NOTE — Progress Notes (Signed)
Subjective:   Patient ID: Deborah Douglas Seen, female   DOB: 28 y.o.   MRN: [id number]   HPI Patient presents stating she is had a lot of pain in her right heel and it is been present for several months and feels like a stone bruise.  Patient states it is gradually worsened over that time and she does not smoke and likes to be active   Review of Systems  All other systems reviewed and are negative.       Objective:  Physical Exam Vitals signs and nursing note reviewed.  Constitutional:      Appearance: She is well-developed.  Pulmonary:     Effort: Pulmonary effort is normal.  Musculoskeletal: Normal range of motion.  Skin:    General: Skin is warm.  Neurological:     Mental Status: She is alert.     Neurovascular status intact muscle strength is adequate range of motion within normal limits with exquisite discomfort plantar aspect right heel at the insertional point of the tendon into the calcaneus with inflammation fluid buildup of the medial band.  Patient is noted to have good digital perfusion is well oriented x3 with moderate depression of the arch     Assessment:  Acute plantar fasciitis right with inflammation fluid buildup     Plan:  H&P x-ray reviewed condition discussed and today I did sterile prep and then injected the plantar fascial band 3 mg Kenalog 5 mg Xylocaine and applied fascial brace with instructions for physical therapy supportive shoes and gradual increase in activity levels.  Reappoint in 2 weeks or earlier if needed and placed on diclofenac 75 mg twice daily  X-rays indicate spur but no indication to stress fracture arthritis

## 2018-11-01 ENCOUNTER — Ambulatory Visit: Payer: 59 | Admitting: Podiatry

## 2018-11-09 ENCOUNTER — Other Ambulatory Visit: Payer: Self-pay

## 2018-11-09 ENCOUNTER — Encounter: Payer: Self-pay | Admitting: Podiatry

## 2018-11-09 ENCOUNTER — Ambulatory Visit (INDEPENDENT_AMBULATORY_CARE_PROVIDER_SITE_OTHER): Payer: 59 | Admitting: Podiatry

## 2018-11-09 VITALS — Temp 98.5°F

## 2018-11-09 DIAGNOSIS — M722 Plantar fascial fibromatosis: Secondary | ICD-10-CM

## 2018-11-09 NOTE — Progress Notes (Signed)
Subjective:   Patient ID: Deborah Douglas, female   DOB: 28 y.o.   MRN: [id number]   HPI Patient presents stating the foot feels better injection helping with mild discomfort if she does a lot of activity   ROS      Objective:  Physical Exam  Neurovascular status intact with patient's right heel being much improved upon deep palpation but pain still present at times but overall significant improvement     Assessment:  Plantar fasciitis right that is improved at this time     Plan:  H&P reviewed condition and recommended continued physical therapy anti-inflammatories and supportive shoes and discussed each of these individually.  Reappoint for [place] to recheck

## 2019-01-26 ENCOUNTER — Other Ambulatory Visit: Payer: Self-pay

## 2019-01-26 ENCOUNTER — Ambulatory Visit
Admission: EM | Admit: 2019-01-26 | Discharge: 2019-01-26 | Disposition: A | Discharge status: Home / Self Care | Payer: 59 | Attending: Urgent Care | Admitting: Urgent Care

## 2019-01-26 DIAGNOSIS — S29012A Strain of muscle and tendon of back wall of thorax, initial encounter: Secondary | ICD-10-CM | POA: Diagnosis not present

## 2019-01-26 DIAGNOSIS — T148XXA Other injury of unspecified body region, initial encounter: Secondary | ICD-10-CM

## 2019-01-26 DIAGNOSIS — M62830 Muscle spasm of back: Secondary | ICD-10-CM

## 2019-01-26 MED ORDER — CYCLOBENZAPRINE HCL 5 MG PO TABS: 5.0000 mg | ORAL_TABLET | Freq: Three times a day (TID) | ORAL | 1 refills | Status: DC | PRN

## 2019-01-26 MED ORDER — KETOROLAC TROMETHAMINE 60 MG/2ML IM SOLN
60.0000 mg | Freq: Once | INTRAMUSCULAR | Status: AC
  Administered 2019-01-26: 18:00:00 60 mg via INTRAMUSCULAR

## 2019-01-26 MED ORDER — NAPROXEN 500 MG PO TABS: 500.0000 mg | ORAL_TABLET | Freq: Two times a day (BID) | ORAL | 0 refills | Status: DC

## 2019-01-26 NOTE — Discharge Instructions (Addendum)
For creams, you can use menthol muscle creams over-the-counter.

## 2019-01-26 NOTE — ED Provider Notes (Signed)
MRN: [id number] DOB: 1990-07-24  Subjective:   Deborah Douglas is a 28 y.o. female presenting for 1 day history of acute onset persistent and worsening right mid to lateral back pain.  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  The belt is very heavy and if she is placed it across and over herself she felt the start of her pain.  Has tried topical therapies over-the-counter with minimal relief.  Patient had to call out due to her severe pain.  Denies history of back issues.  Denies any trauma, fall, weakness.  No current facility-administered medications for this encounter.   Current Outpatient Medications:  .  diclofenac (VOLTAREN) 75 MG EC tablet, Take 1 tablet (75 mg total) by mouth 2 (two) times daily., Disp: 50 tablet, Rfl: 2   Allergies  Allergen Reactions  . Isometheptene-Dichloral-Apap     Numbness: MIDRIN    Past Medical History:  Diagnosis Date  . Breathing difficulty   . GERD (gastroesophageal reflux disease)   . Irritable bowel syndrome   . Migraine   . Morbid obesity ([place])   . Polycystic ovarian syndrome   . Urticaria      Past Surgical History:  Procedure Laterality Date  . ADENOIDECTOMY    . ESOPHAGOGASTRODUODENOSCOPY  05/26/2011   Procedure: ESOPHAGOGASTRODUODENOSCOPY (EGD);  Surgeon: Rogene Houston, MD;  Location: [facility];  Service: Endoscopy;  Laterality: N/A;  300  . KNEE ARTHROSCOPY    . TONSILLECTOMY    . TONSILLECTOMY AND ADENOIDECTOMY      ROS  Objective:   Vitals: BP 132/87   Pulse 85   Temp 98.5 F (36.9 C)   Resp 18   LMP 01/04/2019 (Approximate)   SpO2 97%   Physical Exam Constitutional:      General: She is not in acute distress.    Appearance: Normal appearance. She is well-developed. She is not ill-appearing.  HENT:     Head: Normocephalic and atraumatic.     Nose: Nose normal.     Mouth/Throat:     Mouth: Mucous membranes are moist.     Pharynx: Oropharynx is clear.  Eyes:   General: No scleral icterus.    Extraocular Movements: Extraocular movements intact.     Pupils: Pupils are equal, round, and reactive to light.  Cardiovascular:     Rate and Rhythm: Normal rate.  Pulmonary:     Effort: Pulmonary effort is normal.  Musculoskeletal:     Thoracic back: She exhibits decreased range of motion (Slight decrease in lateral flexion, twisting the patient does note that it is improved since yesterday), tenderness (over area outlined), pain (mostly by report, difficult to elicit on exam) and spasm. She exhibits no bony tenderness, no swelling, no edema, no deformity and no laceration.       Back:  Skin:    General: Skin is warm and dry.  Neurological:     General: No focal deficit present.     Mental Status: She is alert and oriented to person, place, and time.  Psychiatric:        Mood and Affect: Mood normal.        Behavior: Behavior normal.      Assessment and Plan :   1. Strain of thoracic back region   2. Muscle strain     We will manage conservatively for thoracic back strain with NSAID and muscle relaxant, rest and modification of physical activity.  Anticipatory guidance provided.  Offered patient work restrictions which she declined for now but states that she will return if symptoms persist or worsen counseled patient on potential for adverse effects with medications prescribed/recommended today, ER and return-to-clinic precautions discussed, patient verbalized understanding.    Jaynee Eagles, PA-C 01/26/19 1754

## 2019-01-26 NOTE — ED Triage Notes (Signed)
Pt presents with c/o right scapular pain radiates to lower back, began yesterday

## 2019-12-21 ENCOUNTER — Emergency Department (INDEPENDENT_AMBULATORY_CARE_PROVIDER_SITE_OTHER)
Admission: EM | Admit: 2019-12-21 | Discharge: 2019-12-21 | Disposition: A | Discharge status: Home / Self Care | Payer: 59 | Source: Home / Self Care

## 2019-12-21 ENCOUNTER — Emergency Department: Admit: 2019-12-21 | Discharge status: Absent without leave | Payer: Self-pay

## 2019-12-21 DIAGNOSIS — R519 Headache, unspecified: Secondary | ICD-10-CM

## 2019-12-21 MED ORDER — KETOROLAC TROMETHAMINE 30 MG/ML IJ SOLN: 30.0000 mg | Freq: Once | INTRAMUSCULAR | Status: DC

## 2019-12-21 MED ORDER — DEXAMETHASONE SODIUM PHOSPHATE 10 MG/ML IJ SOLN
10.0000 mg | Freq: Once | INTRAMUSCULAR | Status: AC
  Administered 2019-12-21: 10 mg via INTRAMUSCULAR

## 2019-12-21 MED ORDER — SUMATRIPTAN SUCCINATE 6 MG/0.5ML ~~LOC~~ SOLN: 6.0000 mg | Freq: Once | SUBCUTANEOUS | Status: DC

## 2019-12-21 MED ORDER — KETOROLAC TROMETHAMINE 30 MG/ML IJ SOLN
60.0000 mg | Freq: Once | INTRAMUSCULAR | Status: AC
  Administered 2019-12-21: 60 mg via INTRAMUSCULAR

## 2019-12-21 MED ORDER — BUTALBITAL-APAP-CAFFEINE 50-325-40 MG PO TABS: 1.0000 | ORAL_TABLET | Freq: Four times a day (QID) | ORAL | 0 refills | Status: AC | PRN

## 2019-12-21 MED ORDER — METOCLOPRAMIDE HCL 5 MG/ML IJ SOLN
5.0000 mg | Freq: Once | INTRAMUSCULAR | Status: AC
  Administered 2019-12-21: 5 mg via INTRAMUSCULAR

## 2019-12-21 MED ORDER — METOCLOPRAMIDE HCL 10 MG PO TABS: 10.0000 mg | ORAL_TABLET | Freq: Four times a day (QID) | ORAL | 0 refills | Status: DC

## 2019-12-21 MED ORDER — TIZANIDINE HCL 4 MG PO TABS: 4.0000 mg | ORAL_TABLET | Freq: Four times a day (QID) | ORAL | 0 refills | Status: DC | PRN

## 2019-12-21 NOTE — ED Triage Notes (Signed)
Pt c/o migraine since Monday. Hx since 29 years old. Nausea and loss of appetite, light sensitvity. Taking naproxen prn. No known covid exposure. Has not had covid vaccinations.

## 2019-12-21 NOTE — ED Provider Notes (Signed)
[facility]    CSN: [id number] Arrival date & time: 12/21/19  1427      History   Chief Complaint Chief Complaint  Patient presents with  . Headache    x 4 days    HPI Deborah Douglas is a 29 y.o. female.   HPI  Migraineous HA x 4 days. Initialy mild and now involves pressure behind the eyes. She has taken naproxen without relief of HA pain. Headache is now located behind the eyes and she endorses light sensitivity and nausea.  Past Medical History:  Diagnosis Date  . Breathing difficulty   . GERD (gastroesophageal reflux disease)   . Irritable bowel syndrome   . Migraine   . Morbid obesity ([place])   . Polycystic ovarian syndrome   . Urticaria     Patient Active Problem List   Diagnosis Date Noted  . Thyroid antibody positive 09/10/2016  . Abnormal liver enzymes 09/10/2016  . PCOS (polycystic ovarian syndrome) 09/10/2016  . 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 Chronic urticaria 06/09/2016  . Migraine without aura and without status migrainosus, not intractable 12/05/2014  . Labral tear of shoulder 08/23/2014  . Migraine 05/30/2014  . Diarrhea 02/05/2014  . IBS (irritable bowel syndrome) 12/06/2013  . H/O iron deficiency 12/06/2013  . RUQ abdominal pain 03/20/2012  . GERD (gastroesophageal reflux disease) 05/25/2011  . Epigastric pain 05/25/2011  . 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 ADENOIDECTOMY    . ESOPHAGOGASTRODUODENOSCOPY  05/26/2011   Procedure: ESOPHAGOGASTRODUODENOSCOPY (EGD);  Surgeon: Rogene Houston, MD;  Location: [facility];  Service: Endoscopy;  Laterality: N/A;  300  . KNEE ARTHROSCOPY    . TONSILLECTOMY    . TONSILLECTOMY AND ADENOIDECTOMY      OB History   No obstetric history on file.      Home Medications    Prior to Admission medications   Medication Sig Start Date End Date Taking? Authorizing Provider  cyclobenzaprine (FLEXERIL) 5 MG tablet Take 1 tablet (5 mg total) by mouth 3  (three) times daily as needed for muscle spasms. 01/26/19   Jaynee Eagles, PA-C  diclofenac (VOLTAREN) 75 MG EC tablet Take 1 tablet (75 mg total) by mouth 2 (two) times daily. 10/18/18   Wallene Huh, DPM  naproxen (NAPROSYN) 500 MG tablet Take 1 tablet (500 mg total) by mouth 2 (two) times daily. 01/26/19   Jaynee Eagles, PA-C    Family History Family History  Problem Relation Age of Onset  . GER disease Mother   . Irritable bowel syndrome Mother   . Addison's disease Mother   . Allergic rhinitis Mother   . GER disease Father   . Healthy Sister   . Healthy Sister   . Allergic rhinitis Sister   . Allergic rhinitis Maternal Grandmother   . Angioedema Neg Hx   . Asthma Neg Hx   . Eczema Neg Hx   . Immunodeficiency Neg Hx   . Urticaria Neg Hx     Social History Social History   Tobacco Use  . Smoking status: Never Smoker  . Smokeless tobacco: Never Used  Vaping Use  . Vaping Use: Never used  Substance Use Topics  . Alcohol use: No  . Drug use: No     Allergies   Isometheptene-dichloral-apap  Review of Systems Review of Systems  Pertinent negatives listed in HPI Physical Exam Triage Vital Signs ED Triage Vitals  Enc Vitals Group  BP 12/21/19 1449 137/84     Pulse Rate 12/21/19 1449 (!) 109     Resp 12/21/19 1449 18     Temp 12/21/19 1449 99 F (37.2 C)     Temp Source 12/21/19 1449 Oral     SpO2 12/21/19 1449 96 %     Weight --      Height --      Head Circumference --      Peak Flow --      Pain Score 12/21/19 1451 7     Pain Loc --      Pain Edu? --      Excl. in [place]? --    No data found.  Updated Vital Signs BP 137/84 (BP Location: Right Arm)   Pulse (!) 109   Temp 99 F (37.2 C) (Oral)   Resp 18   LMP  (LMP Unknown)   SpO2 96%   Visual Acuity Right Eye Distance:   Left Eye Distance:   Bilateral Distance:    Right Eye Near:   Left Eye Near:    Bilateral Near:     Physical Exam Eyes:     Extraocular Movements: Extraocular movements  intact.     Right eye: Normal extraocular motion.     Left eye: Normal extraocular motion.     Pupils: Pupils are equal, round, and reactive to light.  Cardiovascular:     Rate and Rhythm: Normal rate and regular rhythm.  Pulmonary:     Effort: Pulmonary effort is normal.     Breath sounds: Normal breath sounds.  Musculoskeletal:        General: Normal range of motion.  Skin:    General: Skin is warm and dry.     Capillary Refill: Capillary refill takes less than 2 seconds.  Neurological:     Mental Status: She is alert.     GCS: GCS eye subscore is 4. GCS verbal subscore is 5. GCS motor subscore is 6.  Psychiatric:        Mood and Affect: Mood normal.     UC Treatments / Results  Labs (all labs ordered are listed, but only abnormal results are displayed) Labs Reviewed - No data to display  EKG   Radiology No results found.  Procedures Procedures (including critical care time)  Medications Ordered in UC Medications  metoCLOPramide (REGLAN) injection 5 mg (5 mg Intramuscular Given 12/21/19 1537)  dexamethasone (DECADRON) injection 10 mg (10 mg Intramuscular Given 12/21/19 1537)  ketorolac (TORADOL) 30 MG/ML injection 60 mg (60 mg Intramuscular Given 12/21/19 1537)    Initial Impression / Assessment and Plan / UC Course  I have reviewed the triage vital signs and the nursing notes.  Pertinent labs & imaging results that were available during my care of the patient were reviewed by me and considered in my medical decision making (see chart for details).    Headache cocktail administered. Patient tolerated. PRN medication for HA prescribed per discharge medication orders. Recommended follow-up with PCP for referral to headache clinic if HA persists. Red flags discussed. AVS provided Final Clinical Impressions(s) / UC Diagnoses   Final diagnoses:  Persistent headaches   Discharge Instructions   None    ED Prescriptions    Medication Sig Dispense Auth. Provider    tiZANidine (ZANAFLEX) 4 MG tablet Take 1 tablet (4 mg total) by mouth every 6 (six) hours as needed for muscle spasms. 30 tablet Scot Jun, FNP   butalbital-acetaminophen-caffeine (FIORICET) [phone] MG tablet  Take 1 tablet by mouth every 6 (six) hours as needed for headache. 20 tablet Scot Jun, FNP   metoCLOPramide (REGLAN) 10 MG tablet Take 1 tablet (10 mg total) by mouth every 6 (six) hours. 30 tablet Scot Jun, FNP     PDMP not reviewed this encounter.   Scot Jun, FNP 12/24/19 1857

## 2019-12-25 ENCOUNTER — Ambulatory Visit (INDEPENDENT_AMBULATORY_CARE_PROVIDER_SITE_OTHER): Payer: 59 | Admitting: Family Medicine

## 2019-12-25 ENCOUNTER — Other Ambulatory Visit: Payer: Self-pay

## 2019-12-25 ENCOUNTER — Encounter: Payer: Self-pay | Admitting: Family Medicine

## 2019-12-25 DIAGNOSIS — R5383 Other fatigue: Secondary | ICD-10-CM

## 2019-12-25 DIAGNOSIS — R531 Weakness: Secondary | ICD-10-CM

## 2019-12-25 DIAGNOSIS — U071 COVID-19: Secondary | ICD-10-CM | POA: Diagnosis not present

## 2019-12-25 DIAGNOSIS — R110 Nausea: Secondary | ICD-10-CM

## 2019-12-25 DIAGNOSIS — R519 Headache, unspecified: Secondary | ICD-10-CM

## 2019-12-25 MED ORDER — ALBUTEROL SULFATE HFA 108 (90 BASE) MCG/ACT IN AERS: INHALATION_SPRAY | RESPIRATORY_TRACT | 0 refills | Status: DC

## 2019-12-25 MED ORDER — ONDANSETRON 4 MG PO TBDP: 4.0000 mg | ORAL_TABLET | Freq: Three times a day (TID) | ORAL | 0 refills | Status: DC | PRN

## 2019-12-25 NOTE — Progress Notes (Signed)
Patient ID: Deborah Douglas, female    DOB: 01-Aug-1990, 29 y.o.   MRN: [id number]   Chief Complaint  Patient presents with  . Covid Positive    Covid postive by at home test sunday. headache, fatigue, no appetite, cough, shortness of breath at times and wheezing. would like referral to antibody clinic    Subjective:    HPI  Pt having headache started 1 wk ago, then progressed with URI symptoms 2-3 days ago. Pt feeling wanting to pass out. Nausea "bad" and hasn't eaten in 1 week per the sister.  Pt stating has had about  48-60 oz fluids daily.  Pt stating urinating normally. Tested 2 days ago with covid, positive with home testing. Weakness and feeling faint a few days. Worse cough at night. Worse last 2 days.  Wheezing and hard to breath. H/o pcos and ITP. Non productive cough. trying to drink fluids. Took to urgent care 3 days ago and dx with migraines and given Toradol, nausea meds, and reglan.   Thought was a migraine at that time and didn't test for covid per sister. Gave reglan every 6hrs and still having nausea and not helping much. Pt stating dec eating since no taste for food, dec appetite, and nausea.  Has not had covid vaccines.  Medical History Deborah Douglas has a past medical history of Breathing difficulty, GERD (gastroesophageal reflux disease), Irritable bowel syndrome, Migraine, Morbid obesity ([place]), Polycystic ovarian syndrome, and Urticaria.   Outpatient Encounter Medications as of 12/25/2019  Medication Sig  . butalbital-acetaminophen-caffeine (FIORICET) 50-325-40 MG tablet Take 1 tablet by mouth every 6 (six) hours as needed for headache.  . diclofenac (VOLTAREN) 75 MG EC tablet Take 1 tablet (75 mg total) by mouth 2 (two) times daily.  . metoCLOPramide (REGLAN) 10 MG tablet Take 1 tablet (10 mg total) by mouth every 6 (six) hours.  . naproxen (NAPROSYN) 500 MG tablet Take 1 tablet (500 mg total) by mouth 2 (two) times daily.  Marland Kitchen tiZANidine (ZANAFLEX) 4 MG tablet Take  1 tablet (4 mg total) by mouth every 6 (six) hours as needed for muscle spasms.  Marland Kitchen albuterol (VENTOLIN HFA) 108 (90 Base) MCG/ACT inhaler 2 puff by inhalation every 2-4 hrs prn coughing, wheezing, or short of breath.  . ondansetron (ZOFRAN-ODT) 4 MG disintegrating tablet Take 1 tablet (4 mg total) by mouth every 8 (eight) hours as needed for nausea or vomiting.   No facility-administered encounter medications on file as of 12/25/2019.     Review of Systems  Constitutional: Positive for activity change, appetite change and fatigue. Negative for chills and fever.  HENT: Positive for congestion. Negative for ear pain, postnasal drip, rhinorrhea, sinus pressure, sinus pain, sneezing and sore throat.   Eyes: Negative for pain, discharge and itching.  Respiratory: Positive for cough and shortness of breath. Negative for wheezing.   Cardiovascular: Negative for chest pain and leg swelling.  Gastrointestinal: Positive for nausea. Negative for abdominal pain, diarrhea and vomiting.  Genitourinary: Negative for dysuria and frequency.  Musculoskeletal: Negative for arthralgias and back pain.  Skin: Negative for rash.  Neurological: Positive for weakness and headaches. Negative for dizziness, syncope and numbness.       +feeling faint, weak     Vitals LMP  (LMP Unknown)  VS- 96%, 97 bpm, RR- 20, T 98.44F, BP 120/80 -pt had nail polish on fingernails Objective:   Physical Exam Vitals and nursing note reviewed.  Constitutional:      General: She is not in  acute distress.    Appearance: Normal appearance. She is ill-appearing. She is not toxic-appearing.  HENT:     Head: Normocephalic and atraumatic.     Right Ear: Tympanic membrane, ear canal and external ear normal.     Left Ear: Tympanic membrane, ear canal and external ear normal.     Nose: Nose normal. No congestion or rhinorrhea.     Mouth/Throat:     Mouth: Mucous membranes are moist.     Pharynx: Oropharynx is clear. No oropharyngeal  exudate or posterior oropharyngeal erythema.  Eyes:     Extraocular Movements: Extraocular movements intact.     Conjunctiva/sclera: Conjunctivae normal.     Pupils: Pupils are equal, round, and reactive to light.  Cardiovascular:     Rate and Rhythm: Normal rate and regular rhythm.  Pulmonary:     Effort: Pulmonary effort is normal. No respiratory distress.     Breath sounds: Normal breath sounds. No wheezing, rhonchi or rales.  Musculoskeletal:        General: Normal range of motion.     Cervical back: Normal range of motion.  Skin:    General: Skin is warm and dry.     Findings: No rash.  Neurological:     General: No focal deficit present.     Mental Status: She is alert and oriented to person, place, and time.     Cranial Nerves: No cranial nerve deficit.     Gait: Gait normal.  Psychiatric:        Mood and Affect: Mood normal.        Behavior: Behavior normal.        Thought Content: Thought content normal.        Judgment: Judgment normal.      Assessment and Plan   1. COVID-19  2. Nonintractable headache, unspecified chronicity pattern, unspecified headache type  3. Fatigue, unspecified type  4. Weakness  5. Nausea   For covid 19 illness- Called and added pt to the physician call line for infusion clinic. Pt advised to take vit D, zinc, vit C. Also use tylenol/ibuprofen for pain and achiness. mucinex or dayquil prn. Albuterol for coughing, sob, or wheezing. For nausea use zofran or can use relgan if that works better.  increase hydration. If feeling faint or weak, needs to go to ER for fluids and labs. If not able to keep up hydration or not urinating needing to go to ER for evaluation.   Pt and sister in agreement.  F/u prn.

## 2019-12-26 ENCOUNTER — Ambulatory Visit (HOSPITAL_COMMUNITY)
Admission: RE | Admit: 2019-12-26 | Discharge: 2019-12-26 | Disposition: A | Discharge status: Home / Self Care | Payer: 59 | Source: Ambulatory Visit | Attending: Pulmonary Disease | Admitting: Pulmonary Disease

## 2019-12-26 ENCOUNTER — Telehealth: Payer: Self-pay | Admitting: Physician Assistant

## 2019-12-26 ENCOUNTER — Other Ambulatory Visit: Payer: Self-pay | Admitting: Physician Assistant

## 2019-12-26 DIAGNOSIS — U071 COVID-19: Secondary | ICD-10-CM | POA: Diagnosis not present

## 2019-12-26 DIAGNOSIS — E669 Obesity, unspecified: Secondary | ICD-10-CM

## 2019-12-26 MED ORDER — DIPHENHYDRAMINE HCL 50 MG/ML IJ SOLN: 50.0000 mg | Freq: Once | INTRAMUSCULAR | Status: DC | PRN

## 2019-12-26 MED ORDER — SODIUM CHLORIDE 0.9 % IV SOLN: INTRAVENOUS | Status: DC | PRN

## 2019-12-26 MED ORDER — METHYLPREDNISOLONE SODIUM SUCC 125 MG IJ SOLR: 125.0000 mg | Freq: Once | INTRAMUSCULAR | Status: DC | PRN

## 2019-12-26 MED ORDER — SODIUM CHLORIDE 0.9 % IV SOLN: Freq: Once | INTRAVENOUS | Status: DC

## 2019-12-26 MED ORDER — EPINEPHRINE 0.3 MG/0.3ML IJ SOAJ: 0.3000 mg | Freq: Once | INTRAMUSCULAR | Status: DC | PRN

## 2019-12-26 MED ORDER — FAMOTIDINE IN NACL 20-0.9 MG/50ML-% IV SOLN: 20.0000 mg | Freq: Once | INTRAVENOUS | Status: DC | PRN

## 2019-12-26 MED ORDER — SODIUM CHLORIDE 0.9 % IV SOLN
1200.0000 mg | Freq: Once | INTRAVENOUS | Status: DC
  Administered 2019-12-26: 1200 mg via INTRAVENOUS

## 2019-12-26 MED ORDER — ALBUTEROL SULFATE HFA 108 (90 BASE) MCG/ACT IN AERS: 2.0000 | INHALATION_SPRAY | Freq: Once | RESPIRATORY_TRACT | Status: DC | PRN

## 2019-12-26 NOTE — Telephone Encounter (Signed)
Called to Discuss with patient about Covid symptoms and the use of the monoclonal antibody infusion for those with mild to moderate Covid symptoms and at a high risk of hospitalization.     Pt appears to qualify for this infusion due to co-morbid conditions and/or a member of an at-risk group in accordance with the FDA Emergency Use Authorization.    Unable to reach pt - I left a voicemail and sent Mychart message. She qualifies by BMI. Sx started last week.   Tami Lin Emalene Welte, PA-C 12/26/2019, 7:20 AM

## 2019-12-26 NOTE — Telephone Encounter (Signed)
Pt sister returned call  [phone] They have an apt for the shots at 2:30 but they rather have the infusion but they are about to be out of their 10 day window and need to know if this can be setup asap

## 2019-12-26 NOTE — Progress Notes (Signed)
I connected by phone with Deborah Douglas Seen on 12/26/2019 at 1:33 PM to discuss the potential use of a new treatment for mild to moderate COVID-19 viral infection in non-hospitalized patients.  This patient is a 29 y.o. female that meets the FDA criteria for Emergency Use Authorization of COVID monoclonal antibody casirivimab/imdevimab or bamlanivimab/eteseviamb.  Has a (+) direct SARS-CoV-2 viral test result  Has mild or moderate COVID-19   Is NOT hospitalized due to COVID-19  Is within 10 days of symptom onset  Has at least one of the high risk factor(s) for progression to severe COVID-19 and/or hospitalization as defined in EUA.  Specific high risk criteria : BMI > 25   I have spoken and communicated the following to the patient or parent/caregiver regarding COVID monoclonal antibody treatment:  1. FDA has authorized the emergency use for the treatment of mild to moderate COVID-19 in adults and pediatric patients with positive results of direct SARS-CoV-2 viral testing who are 104 years of age and older weighing at least 40 kg, and who are at high risk for progressing to severe COVID-19 and/or hospitalization.  2. The significant known and potential risks and benefits of COVID monoclonal antibody, and the extent to which such potential risks and benefits are unknown.  3. Information on available alternative treatments and the risks and benefits of those alternatives, including clinical trials.  4. Patients treated with COVID monoclonal antibody should continue to self-isolate and use infection control measures (e.g., wear mask, isolate, social distance, avoid sharing personal items, clean and disinfect "high touch" surfaces, and frequent handwashing) according to CDC guidelines.   5. The patient or parent/caregiver has the option to accept or refuse COVID monoclonal antibody treatment.  After reviewing this information with the patient, the patient has agreed to receive one of the  available covid 19 monoclonal antibodies and will be provided an appropriate fact sheet prior to infusion. [name], [place] 12/26/2019 1:33 PM   Please call: [phone] if there is a cancellation 10/6.

## 2019-12-26 NOTE — Discharge Instructions (Signed)

## 2019-12-26 NOTE — Progress Notes (Signed)
  Diagnosis: COVID-19  Physician: Asencion Noble, MD  Procedure: Covid Infusion Clinic Med: casirivimab\imdevimab infusion - Provided patient with casirivimab\imdevimab fact sheet for patients, parents and caregivers prior to infusion.  Complications: No immediate complications noted.  Discharge: Discharged home   Deborah Douglas 12/26/2019

## 2019-12-27 ENCOUNTER — Ambulatory Visit (HOSPITAL_COMMUNITY): Payer: 59

## 2020-05-12 ENCOUNTER — Encounter (HOSPITAL_COMMUNITY): Payer: Self-pay

## 2020-05-12 ENCOUNTER — Emergency Department (HOSPITAL_COMMUNITY): Payer: No Typology Code available for payment source

## 2020-05-12 ENCOUNTER — Other Ambulatory Visit: Payer: Self-pay

## 2020-05-12 ENCOUNTER — Emergency Department (HOSPITAL_COMMUNITY)
Admission: EM | Admit: 2020-05-12 | Discharge: 2020-05-12 | Disposition: A | Discharge status: Home / Self Care | Payer: No Typology Code available for payment source | Attending: Emergency Medicine | Admitting: Emergency Medicine

## 2020-05-12 DIAGNOSIS — S4991XA Unspecified injury of right shoulder and upper arm, initial encounter: Secondary | ICD-10-CM | POA: Diagnosis present

## 2020-05-12 DIAGNOSIS — S40812A Abrasion of left upper arm, initial encounter: Secondary | ICD-10-CM | POA: Diagnosis not present

## 2020-05-12 DIAGNOSIS — R519 Headache, unspecified: Secondary | ICD-10-CM | POA: Insufficient documentation

## 2020-05-12 DIAGNOSIS — Y92410 Unspecified street and highway as the place of occurrence of the external cause: Secondary | ICD-10-CM | POA: Diagnosis not present

## 2020-05-12 DIAGNOSIS — S40811A Abrasion of right upper arm, initial encounter: Secondary | ICD-10-CM | POA: Insufficient documentation

## 2020-05-12 DIAGNOSIS — M25512 Pain in left shoulder: Secondary | ICD-10-CM | POA: Diagnosis not present

## 2020-05-12 DIAGNOSIS — Z23 Encounter for immunization: Secondary | ICD-10-CM | POA: Insufficient documentation

## 2020-05-12 MED ORDER — NAPROXEN 375 MG PO TABS: 375.0000 mg | ORAL_TABLET | Freq: Two times a day (BID) | ORAL | 0 refills | Status: DC

## 2020-05-12 MED ORDER — TETANUS-DIPHTH-ACELL PERTUSSIS 5-2.5-18.5 LF-MCG/0.5 IM SUSY
0.5000 mL | PREFILLED_SYRINGE | Freq: Once | INTRAMUSCULAR | Status: AC
  Administered 2020-05-12: 0.5 mL via INTRAMUSCULAR
  Filled 2020-05-12: qty 0.5

## 2020-05-12 MED ORDER — METHOCARBAMOL 500 MG PO TABS: 500.0000 mg | ORAL_TABLET | Freq: Two times a day (BID) | ORAL | 0 refills | Status: DC

## 2020-05-12 NOTE — Discharge Instructions (Addendum)
Your xray was negative.  Return to the emergency department immediately if you develop any of the following symptoms: You have numbness, tingling, or weakness in the arms or legs. You develop severe headaches not relieved with medicine. You have severe neck pain, especially tenderness in the middle of the back of your neck. You have changes in bowel or bladder control. There is increasing pain in any area of the body. You have shortness of breath, light-headedness, dizziness, or fainting. You have chest pain. You feel sick to your stomach (nauseous), throw up (vomit), or sweat. You have increasing abdominal discomfort. There is blood in your urine, stool, or vomit. You have pain in your shoulder (shoulder strap areas). You feel your symptoms are getting worse.
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
## 2020-05-12 NOTE — ED Notes (Signed)
An After Visit Summary was printed and given to the patient. Discharge instructions given and no further questions at this time.  

## 2020-10-07 ENCOUNTER — Ambulatory Visit: Payer: 59 | Attending: Internal Medicine

## 2020-10-07 ENCOUNTER — Other Ambulatory Visit (HOSPITAL_BASED_OUTPATIENT_CLINIC_OR_DEPARTMENT_OTHER): Payer: Self-pay

## 2020-10-07 ENCOUNTER — Other Ambulatory Visit: Payer: Self-pay

## 2020-10-07 DIAGNOSIS — Z23 Encounter for immunization: Secondary | ICD-10-CM

## 2020-10-07 MED ORDER — PFIZER-BIONT COVID-19 VAC-TRIS 30 MCG/0.3ML IM SUSP
INTRAMUSCULAR | 0 refills | Status: DC
  Filled 2020-10-07: qty 0.3, 1d supply, fill #0

## 2020-10-07 NOTE — Progress Notes (Signed)
   Covid-19 Vaccination Clinic  Name:  Deborah Douglas    MRN: [id number] DOB: [birth year]  10/07/2020  Ms. Deborah Douglas was observed post Covid-19 immunization for 15 minutes without incident. She was provided with Vaccine Information Sheet and instruction to access the V-Safe system.   Ms. Deborah Douglas was instructed to call 911 with any severe reactions post vaccine: Difficulty breathing  Swelling of face and throat  A fast heartbeat  A bad rash all over body  Dizziness and weakness   Immunizations Administered     Name Date Dose VIS Date Route   PFIZER Comrnaty(Gray TOP) Covid-19 Vaccine 10/07/2020  2:11 PM 0.3 mL 02/28/2020 Intramuscular   Manufacturer: [employer]   Lot: [id number]   [place]: [phone]

## 2021-07-21 DIAGNOSIS — O133 Gestational [pregnancy-induced] hypertension without significant proteinuria, third trimester: Secondary | ICD-10-CM | POA: Insufficient documentation

## 2022-01-30 ENCOUNTER — Encounter (INDEPENDENT_AMBULATORY_CARE_PROVIDER_SITE_OTHER): Payer: Self-pay | Admitting: Gastroenterology

## 2022-02-01 ENCOUNTER — Ambulatory Visit: Admit: 2022-02-01 | Discharge status: Absent without leave | Payer: 59

## 2022-03-18 ENCOUNTER — Ambulatory Visit (INDEPENDENT_AMBULATORY_CARE_PROVIDER_SITE_OTHER): Payer: 59 | Admitting: Family Medicine

## 2022-03-18 ENCOUNTER — Encounter: Payer: Self-pay | Admitting: Family Medicine

## 2022-03-18 VITALS — BP 131/83 | HR 92 | Ht 66.0 in | Wt 243.0 lb

## 2022-03-18 DIAGNOSIS — Z1159 Encounter for screening for other viral diseases: Secondary | ICD-10-CM | POA: Diagnosis not present

## 2022-03-18 DIAGNOSIS — Z114 Encounter for screening for human immunodeficiency virus [HIV]: Secondary | ICD-10-CM | POA: Diagnosis not present

## 2022-03-18 DIAGNOSIS — E785 Hyperlipidemia, unspecified: Secondary | ICD-10-CM

## 2022-03-18 DIAGNOSIS — E559 Vitamin D deficiency, unspecified: Secondary | ICD-10-CM | POA: Diagnosis not present

## 2022-03-18 DIAGNOSIS — R7301 Impaired fasting glucose: Secondary | ICD-10-CM

## 2022-03-18 DIAGNOSIS — Z0001 Encounter for general adult medical examination with abnormal findings: Secondary | ICD-10-CM

## 2022-03-18 DIAGNOSIS — E039 Hypothyroidism, unspecified: Secondary | ICD-10-CM

## 2022-03-18 NOTE — Progress Notes (Signed)
Complete physical exam  Patient: Deborah Douglas   DOB: Aug 06, 1990   31 y.o. Female  MRN: [id number]  Subjective:    Chief Complaint  Patient presents with   Annual Exam   Establish Care    Patient has PCOS and would like to be managed here with that.    Weight Loss    Patient had a baby in May and would like to discuss weight loss options.     Deborah Douglas is a 31 y.o. female who presents today for a complete physical exam. She reports consuming a general diet. Home exercise routine includes yoga. She generally feels well. She reports sleeping fair. She would like to discuss weight loss options on her next appointment. Patient has PCOS and is being managed by her GYN.   Most recent fall risk assessment:    03/18/2022    8:59 AM  Fall Risk   Falls in the past year? 0  Number falls in past yr: 0  Injury with Fall? 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   Chronic urticaria 06/09/2016   Migraine without aura and without status migrainosus, not intractable 12/05/2014   Labral tear of shoulder 08/23/2014   Migraine 05/30/2014   Diarrhea 02/05/2014   IBS (irritable bowel syndrome) 12/06/2013   H/O iron deficiency 12/06/2013   RUQ abdominal pain 03/20/2012   GERD (gastroesophageal reflux disease) 05/25/2011   Epigastric pain 05/25/2011   FIBROMA 09/04/2008      Patient Care Team: Kathyrn Drown, MD as PCP - General (Family Medicine)   Outpatient Medications Prior to Visit  Medication Sig   escitalopram (LEXAPRO) 10 MG tablet Take 10 mg by mouth daily.   albuterol (VENTOLIN  HFA) 108 (90 Base) MCG/ACT inhaler 2 puff by inhalation every 2-4 hrs prn coughing, wheezing, or short of breath. (Patient not taking: Reported on 03/18/2022)   COVID-19 mRNA Vac-TriS, Pfizer, (PFIZER-BIONT COVID-19 VAC-TRIS) SUSP injection Inject into the muscle. (Patient not taking: Reported on 03/18/2022)   diclofenac (VOLTAREN) 75 MG EC tablet Take 1 tablet (75 mg total) by mouth 2 (two) times daily. (Patient not taking: Reported on 03/18/2022)   methocarbamol (ROBAXIN) 500 MG tablet Take 1 tablet (500 mg total) by mouth 2 (two) times daily. (Patient not taking: Reported on 03/18/2022)   metoCLOPramide (REGLAN) 10 MG tablet Take 1 tablet (10 mg total) by mouth every 6 (six) hours. (Patient not taking: Reported on 03/18/2022)   naproxen (NAPROSYN) 375 MG tablet Take 1 tablet (375 mg total) by mouth 2 (two) times daily with a meal. (Patient not taking: Reported on 03/18/2022)   ondansetron (ZOFRAN-ODT) 4 MG disintegrating tablet Take 1 tablet (4 mg total) by mouth every 8 (eight) hours as needed for nausea or vomiting. (Patient not taking: Reported on 03/18/2022)   tiZANidine (ZANAFLEX) 4 MG tablet Take 1 tablet (4 mg total) by mouth every 6 (six) hours as needed for muscle spasms. (Patient not taking: Reported on 03/18/2022)   No facility-administered medications prior to visit.  Review of Systems  Constitutional:  Negative for chills, diaphoresis, fever, malaise/fatigue and weight loss.  HENT:  Negative for congestion, ear discharge, ear pain, hearing loss, nosebleeds, sinus pain, sore throat and tinnitus.   Eyes:  Negative for blurred vision, double vision, photophobia, pain, discharge and redness.  Respiratory:  Negative for cough, hemoptysis, sputum production, shortness of breath, wheezing and stridor.        Snoring at night  Cardiovascular:  Negative for chest pain, palpitations, orthopnea, claudication, leg swelling and PND.  Gastrointestinal:  Negative for abdominal pain,  constipation, diarrhea, heartburn and nausea.  Musculoskeletal:  Negative for back pain, falls, joint pain, myalgias and neck pain.  Skin:  Negative for itching and rash.  Neurological:  Negative for dizziness and headaches.  Endo/Heme/Allergies:  Does not bruise/bleed easily.  Psychiatric/Behavioral:  Negative for depression. The patient does not have insomnia.           Objective:     BP 131/83   Pulse 92   Ht _0  (1.676 m)   Wt 243 lb (110.2 kg)   SpO2 97%   BMI 39.22 kg/m  BP Readings from Last 3 Encounters:  03/18/22 131/83  05/12/20 130/73  12/26/19 110/79    Physical Exam HENT:     Head: Normocephalic.     Right Ear: Tympanic membrane normal.     Left Ear: Tympanic membrane normal.     Nose: Nose normal.     Mouth/Throat:     Mouth: Mucous membranes are moist.     Pharynx: Oropharynx is clear.  Eyes:     Extraocular Movements: Extraocular movements intact.     Pupils: Pupils are equal, round, and reactive to light.  Cardiovascular:     Rate and Rhythm: Normal rate.     Pulses: Normal pulses.     Heart sounds: Normal heart sounds.  Pulmonary:     Effort: Pulmonary effort is normal.     Breath sounds: Normal breath sounds.  Abdominal:     General: Bowel sounds are normal.     Palpations: Abdomen is soft.  Musculoskeletal:        General: Normal range of motion.     Cervical back: Normal range of motion.  Skin:    General: Skin is warm and dry.     Capillary Refill: Capillary refill takes less than 2 seconds.  Neurological:     Mental Status: She is alert and oriented to person, place, and time.  Psychiatric:        Mood and Affect: Mood normal.      No results found for any visits on 03/18/22. Last CBC Lab Results  Component Value Date   WBC 9.0 08/24/2016   HGB 13.8 08/24/2016   HCT 41.9 08/24/2016   MCV 85.5 08/24/2016   MCH 28.2 08/24/2016   RDW 14.0 08/24/2016   PLT 298 85/46/2703   Last metabolic panel Lab Results  Component  Value Date   GLUCOSE 79 08/24/2016   NA 140 08/24/2016   K 4.1 08/24/2016   CL 106 08/24/2016   CO2 24 08/24/2016   BUN 9 08/24/2016   CREATININE 0.77 08/24/2016   GFRNONAA >89 08/24/2016   CALCIUM 9.4 08/24/2016   PROT 6.7 09/27/2016   ALBUMIN 4.0 09/27/2016   BILITOT 0.2 09/27/2016   ALKPHOS 66 09/27/2016   AST 17 09/27/2016   ALT 28 09/27/2016   Last lipids Lab Results  Component Value Date   CHOL 154 10/31/2012   HDL  50 10/31/2012   LDLCALC 73 10/31/2012   TRIG 153 (H) 10/31/2012   CHOLHDL 3.1 10/31/2012   Last hemoglobin A1c No results found for: "HGBA1C" Last thyroid functions Lab Results  Component Value Date   TSH 2.59 08/24/2016   Last vitamin D Lab Results  Component Value Date   VD25OH 41 10/31/2012   Last vitamin B12 and Folate Lab Results  Component Value Date   VITAMINB12 280 10/30/2012        Assessment & Plan:    Routine Health Maintenance and Physical Exam  Immunization History  Administered Date(s) Administered   DTaP 07/15/1993, 07/05/1996   IPV 07/15/1993, 07/05/1996   Influenza-Unspecified 01/20/2018   MMR 07/15/1993, 07/05/1996   Meningococcal Conjugate 12/13/2005   PFIZER Comirnaty(Gray Top)Covid-19 Tri-Sucrose Vaccine 10/07/2020   Td 10/29/2004   Tdap 05/12/2020    Health Maintenance  Topic Date Due   PAP SMEAR-Modifier  02/18/2017   INFLUENZA VACCINE  10/20/2021   COVID-19 Vaccine (2 - 2023-24 season) 11/20/2021   DTaP/Tdap/Td (5 - Td or Tdap) 05/12/2030   Hepatitis C Screening  Completed   HIV Screening  Completed   HPV VACCINES  Aged Out    Discussed health benefits of physical activity, and encouraged her to engage in regular exercise appropriate for her age and condition.  Problem List Items Addressed This Visit   None Visit Diagnoses     Need for hepatitis C screening test    -  Primary   Relevant Orders   Hepatitis C antibody   Vitamin D deficiency       Relevant Orders   VITAMIN D 25 Hydroxy (Vit-D  Deficiency, Fractures)   Encounter for screening for HIV       Relevant Orders   HIV Antibody (routine testing w rflx)   Hypothyroidism, unspecified type       Relevant Orders   TSH + free T4   Hyperlipidemia, unspecified hyperlipidemia type       Relevant Orders   Lipid panel   CMP14+EGFR   CBC with Differential/Platelet   IFG (impaired fasting glucose)       Relevant Orders   Hemoglobin A1c      No follow-ups on file.     Renard Hamper Ria Comment, FNP

## 2022-03-18 NOTE — Patient Instructions (Addendum)
Follow up in 3 weeks to discuss weight loss management and bothersome snoring at night

## 2022-03-19 DIAGNOSIS — Z0001 Encounter for general adult medical examination with abnormal findings: Secondary | ICD-10-CM | POA: Insufficient documentation

## 2022-03-19 LAB — CBC WITH DIFFERENTIAL/PLATELET
Basophils Absolute: 0.1 10*3/uL (ref 0.0–0.2)
Basos: 1 %
EOS (ABSOLUTE): 0.2 10*3/uL (ref 0.0–0.4)
Eos: 2 %
Hematocrit: 43.5 % (ref 34.0–46.6)
Hemoglobin: 13.9 g/dL (ref 11.1–15.9)
Immature Grans (Abs): 0 10*3/uL (ref 0.0–0.1)
Immature Granulocytes: 0 %
Lymphocytes Absolute: 3.1 10*3/uL (ref 0.7–3.1)
Lymphs: 33 %
MCH: 26.8 pg (ref 26.6–33.0)
MCHC: 32 g/dL (ref 31.5–35.7)
MCV: 84 fL (ref 79–97)
Monocytes Absolute: 0.6 10*3/uL (ref 0.1–0.9)
Monocytes: 6 %
Neutrophils Absolute: 5.6 10*3/uL (ref 1.4–7.0)
Neutrophils: 58 %
Platelets: 314 10*3/uL (ref 150–450)
RBC: 5.19 x10E6/uL (ref 3.77–5.28)
RDW: 14.6 % (ref 11.7–15.4)
WBC: 9.5 10*3/uL (ref 3.4–10.8)

## 2022-03-19 LAB — LIPID PANEL
Chol/HDL Ratio: 4 ratio (ref 0.0–4.4)
Cholesterol, Total: 177 mg/dL (ref 100–199)
HDL: 44 mg/dL (ref >39)
LDL Chol Calc (NIH): 109 mg/dL — ABNORMAL HIGH (ref 0–99)
Triglycerides: 134 mg/dL (ref 0–149)
VLDL Cholesterol Cal: 24 mg/dL (ref 5–40)

## 2022-03-19 LAB — HEPATITIS C ANTIBODY: Hep C Virus Ab: NONREACTIVE

## 2022-03-19 LAB — CMP14+EGFR
ALT: 19 IU/L (ref 0–32)
AST: 19 IU/L (ref 0–40)
Albumin/Globulin Ratio: 1.6 (ref 1.2–2.2)
Albumin: 4.2 g/dL (ref 4.0–5.0)
Alkaline Phosphatase: 71 IU/L (ref 44–121)
BUN/Creatinine Ratio: 19 (ref 9–23)
BUN: 11 mg/dL (ref 6–20)
Bilirubin Total: 0.3 mg/dL (ref 0.0–1.2)
CO2: 21 mmol/L (ref 20–29)
Calcium: 9.1 mg/dL (ref 8.7–10.2)
Chloride: 102 mmol/L (ref 96–106)
Creatinine, Ser: 0.59 mg/dL (ref 0.57–1.00)
Globulin, Total: 2.7 g/dL (ref 1.5–4.5)
Glucose: 83 mg/dL (ref 70–99)
Potassium: 4.2 mmol/L (ref 3.5–5.2)
Sodium: 137 mmol/L (ref 134–144)
Total Protein: 6.9 g/dL (ref 6.0–8.5)
eGFR: 124 mL/min/{1.73_m2} (ref >59)

## 2022-03-19 LAB — HEMOGLOBIN A1C
Est. average glucose Bld gHb Est-mCnc: 114 mg/dL
Hgb A1c MFr Bld: 5.6 % (ref 4.8–5.6)

## 2022-03-19 LAB — VITAMIN D 25 HYDROXY (VIT D DEFICIENCY, FRACTURES): Vit D, 25-Hydroxy: 36.6 ng/mL (ref 30.0–100.0)

## 2022-03-19 LAB — HIV ANTIBODY (ROUTINE TESTING W REFLEX): HIV Screen 4th Generation wRfx: NONREACTIVE

## 2022-03-19 LAB — TSH+FREE T4
Free T4: 1.02 ng/dL (ref 0.82–1.77)
TSH: 6.65 u[IU]/mL — ABNORMAL HIGH (ref 0.450–4.500)

## 2022-03-19 NOTE — Addendum Note (Signed)
Addended by: Juanda Chance on: 03/19/2022 08:18 AM   Modules accepted: Level of Service

## 2022-04-08 ENCOUNTER — Encounter: Payer: Self-pay | Admitting: Family Medicine

## 2022-04-08 ENCOUNTER — Ambulatory Visit: Payer: 59 | Admitting: Family Medicine

## 2022-04-08 VITALS — BP 120/84 | HR 86 | Ht 66.0 in | Wt 244.0 lb

## 2022-04-08 DIAGNOSIS — R0683 Snoring: Secondary | ICD-10-CM | POA: Diagnosis not present

## 2022-04-08 DIAGNOSIS — E6609 Other obesity due to excess calories: Secondary | ICD-10-CM | POA: Diagnosis not present

## 2022-04-08 DIAGNOSIS — Z6839 Body mass index (BMI) 39.0-39.9, adult: Secondary | ICD-10-CM | POA: Diagnosis not present

## 2022-04-08 DIAGNOSIS — E039 Hypothyroidism, unspecified: Secondary | ICD-10-CM

## 2022-04-08 DIAGNOSIS — G4730 Sleep apnea, unspecified: Secondary | ICD-10-CM

## 2022-04-08 NOTE — Patient Instructions (Addendum)
Please follow up in 4 weeks for weight loss management.

## 2022-04-08 NOTE — Progress Notes (Deleted)
   Acute Office Visit  Subjective:    Patient ID: SAINA WAAGE, female    DOB: 1991/02/15, 32 y.o.   MRN: [id number]  Chief Complaint  Patient presents with   Weight Loss   Snoring    Patient is in today for *** Sleep Apnea: Patient presents with possible obstructive sleep apnea. Patent has a 10 months history of symptoms of constant snoring. Patient generally gets 7 hours of sleep per night, and states they generally have {sleep quality:17851}. Snoring of moderate severity is present. Apneic episodes {is/are not:32546} present. Nasal obstruction {is/are not:32546} present.  Patient {has/ has not:15037} had tonsillectomy.        ROS    Objective:    BP 120/84   Pulse 86   Ht '5\' 6"'$  (1.676 m)   Wt 244 lb (110.7 kg)   SpO2 96%   BMI 39.38 kg/m  {Vitals History (Optional):23777}  Physical Exam  No results found for any visits on 04/08/22.     Assessment & Plan:  There are no diagnoses linked to this encounter.  No follow-ups on file.  Renard Hamper Ria Comment, FNP

## 2022-04-09 LAB — TSH+FREE T4
Free T4: 1.01 ng/dL (ref 0.82–1.77)
TSH: 4.84 u[IU]/mL — ABNORMAL HIGH (ref 0.450–4.500)

## 2022-04-11 DIAGNOSIS — E6609 Other obesity due to excess calories: Secondary | ICD-10-CM | POA: Insufficient documentation

## 2022-04-11 DIAGNOSIS — R0683 Snoring: Secondary | ICD-10-CM | POA: Insufficient documentation

## 2022-04-11 NOTE — Progress Notes (Signed)
Patient Office Visit   Subjective   Patient ID: Deborah Douglas, female    DOB: 30-Dec-1990  Age: 32 y.o. MRN: [id number]  CC:  Chief Complaint  Patient presents with   Weight Loss   Snoring    HPI Deborah Douglas presents to establish care. She  has a past medical history of Breathing difficulty, GERD (gastroesophageal reflux disease), Irritable bowel syndrome, Migraine, Morbid obesity ([place]), Polycystic ovarian syndrome, and Urticaria.  Patient presents to clinic for weight loss management. Patient reports having a difficult time losing weight.  Patient denies maintaining an exercise routine and follows a basic general diet.  She feels like her past medical condition of PCOS is due to her weight gain.   Outpatient Encounter Medications as of 04/08/2022  Medication Sig   escitalopram (LEXAPRO) 10 MG tablet Take 10 mg by mouth daily.   albuterol (VENTOLIN HFA) 108 (90 Base) MCG/ACT inhaler 2 puff by inhalation every 2-4 hrs prn coughing, wheezing, or short of breath. (Patient not taking: Reported on 03/18/2022)   COVID-19 mRNA Vac-TriS, Pfizer, (PFIZER-BIONT COVID-19 VAC-TRIS) SUSP injection Inject into the muscle. (Patient not taking: Reported on 03/18/2022)   diclofenac (VOLTAREN) 75 MG EC tablet Take 1 tablet (75 mg total) by mouth 2 (two) times daily. (Patient not taking: Reported on 03/18/2022)   methocarbamol (ROBAXIN) 500 MG tablet Take 1 tablet (500 mg total) by mouth 2 (two) times daily. (Patient not taking: Reported on 03/18/2022)   metoCLOPramide (REGLAN) 10 MG tablet Take 1 tablet (10 mg total) by mouth every 6 (six) hours. (Patient not taking: Reported on 03/18/2022)   naproxen (NAPROSYN) 375 MG tablet Take 1 tablet (375 mg total) by mouth 2 (two) times daily with a meal. (Patient not taking: Reported on 03/18/2022)   ondansetron (ZOFRAN-ODT) 4 MG disintegrating tablet Take 1 tablet (4 mg total) by mouth every 8 (eight) hours as needed for nausea or vomiting. (Patient not taking:  Reported on 03/18/2022)   tiZANidine (ZANAFLEX) 4 MG tablet Take 1 tablet (4 mg total) by mouth every 6 (six) hours as needed for muscle spasms. (Patient not taking: Reported on 03/18/2022)   No facility-administered encounter medications on file as of 04/08/2022.    Past Surgical History:  Procedure Laterality Date   ADENOIDECTOMY     ESOPHAGOGASTRODUODENOSCOPY  05/26/2011   Procedure: ESOPHAGOGASTRODUODENOSCOPY (EGD);  Surgeon: Rogene Houston, MD;  Location: [facility];  Service: Endoscopy;  Laterality: N/A;  300   KNEE ARTHROSCOPY     TONSILLECTOMY     TONSILLECTOMY AND ADENOIDECTOMY      Review of Systems  Constitutional:  Negative for chills and fever.  Respiratory:  Negative for shortness of breath.   Cardiovascular:  Negative for chest pain.  Gastrointestinal:  Negative for heartburn and vomiting.  Genitourinary:  Negative for dysuria.  Skin:  Negative for rash.  Neurological:  Negative for dizziness and headaches.  Psychiatric/Behavioral:  The patient is not nervous/anxious.       Objective    BP 120/84   Pulse 86   Ht '5\' 6"'$  (1.676 m)   Wt 244 lb (110.7 kg)   SpO2 96%   BMI 39.38 kg/m   Physical Exam Cardiovascular:     Rate and Rhythm: Normal rate.     Pulses: Normal pulses.  Pulmonary:     Effort: Pulmonary effort is normal. No respiratory distress.  Skin:    General: Skin is warm and dry.     Capillary Refill: Capillary refill takes less  than 2 seconds.  Neurological:     Mental Status: She is alert.  Psychiatric:        Mood and Affect: Mood normal.       Assessment & Plan:  Hypothyroidism, unspecified type -     TSH + free T4  Sleep apnea, unspecified type -     Home sleep test  Snoring Assessment & Plan: Patient has 10 months  of symptoms of constant snoring. Patient generally gets 7 hours of sleep per night, and states generally restful sleep. Snoring of moderate severity is present. Apneic episodes are not present. Nasal obstruction is  not present.    Sleep study referral placed for further evaluation   Class 2 obesity due to excess calories with body mass index (BMI) of 39.0 to 39.9 in adult, unspecified whether serious comorbidity present Assessment & Plan: Patient BMI 39.38 Encouraged start lifestyle modifications follow diet low in saturated fat, reduce dietary salt intake, avoid fatty foods, maintain an exercise routine 3 to 5 days a week for a minimum total of 150 minutes.  Follow up in 4 weeks for weight loss management      Return in about 4 weeks (around 05/06/2022) for Weight Loss Mangment.   Renard Hamper Ria Comment, FNP

## 2022-04-11 NOTE — Assessment & Plan Note (Signed)
Patient has 10 months  of symptoms of constant snoring. Patient generally gets 7 hours of sleep per night, and states generally restful sleep. Snoring of moderate severity is present. Apneic episodes are not present. Nasal obstruction is not present.    Sleep study referral placed for further evaluation

## 2022-04-11 NOTE — Assessment & Plan Note (Addendum)
Patient BMI 39.38 Encouraged start lifestyle modifications follow diet low in saturated fat, reduce dietary salt intake, avoid fatty foods, maintain an exercise routine 3 to 5 days a week for a minimum total of 150 minutes.  Follow up in 4 weeks for weight loss management

## 2022-04-13 ENCOUNTER — Ambulatory Visit: Payer: 59 | Admitting: Family Medicine

## 2022-04-20 ENCOUNTER — Ambulatory Visit: Payer: Self-pay

## 2022-04-21 ENCOUNTER — Other Ambulatory Visit: Payer: Self-pay

## 2022-04-21 ENCOUNTER — Ambulatory Visit: Payer: 59 | Admitting: Family Medicine

## 2022-04-21 ENCOUNTER — Ambulatory Visit: Payer: Self-pay

## 2022-04-21 ENCOUNTER — Encounter: Payer: Self-pay | Admitting: Family Medicine

## 2022-04-21 VITALS — BP 120/83 | HR 81 | Ht 66.0 in | Wt 245.1 lb

## 2022-04-21 DIAGNOSIS — H6501 Acute serous otitis media, right ear: Secondary | ICD-10-CM | POA: Diagnosis not present

## 2022-04-21 DIAGNOSIS — R0683 Snoring: Secondary | ICD-10-CM | POA: Diagnosis not present

## 2022-04-21 DIAGNOSIS — H6691 Otitis media, unspecified, right ear: Secondary | ICD-10-CM | POA: Insufficient documentation

## 2022-04-21 DIAGNOSIS — H6591 Unspecified nonsuppurative otitis media, right ear: Secondary | ICD-10-CM | POA: Insufficient documentation

## 2022-04-21 DIAGNOSIS — J029 Acute pharyngitis, unspecified: Secondary | ICD-10-CM

## 2022-04-21 DIAGNOSIS — J320 Chronic maxillary sinusitis: Secondary | ICD-10-CM | POA: Insufficient documentation

## 2022-04-21 LAB — POCT RAPID STREP A

## 2022-04-21 MED ORDER — AZITHROMYCIN 250 MG PO TABS: ORAL_TABLET | ORAL | 0 refills | Status: DC

## 2022-04-21 MED ORDER — AZITHROMYCIN 250 MG PO TABS: ORAL_TABLET | ORAL | 0 refills | Status: AC

## 2022-04-21 NOTE — Patient Instructions (Addendum)
F/u with PCP as before, call if you need to be seen sooner  You are treated for left maxillary sinusitis, cervical adenitis, right  otitis media, azithromycin is prescribed, please take entire  5 day antibiotic course  Rapid strep test in office today is NEGATIVE, so you may go to work tomorrow!  I do recommend getting tested for sleep apnea since, as we discussed, if untreated, this may lead to both lung and heart failure  Thanks for choosing [facility], we consider it a privelige to serve you.

## 2022-04-21 NOTE — Progress Notes (Signed)
   Deborah Douglas     MRN: [id number]      DOB: [birth year]   HPI Deborah Douglas is here with a 3 day h/o sore throat and  ear pain  and has also had some sinus pressure on left cheek and chills Positive Strep exposure this past weekend, wants to be sure she does not have strep before returning to work in am and exposing co workers  ROS See HPI  Denies recent fever or chills.  N o other concerns voiced Denies chest congestion, productive cough or wheezing.   PE  BP 120/83 (BP Location: Right Arm, Patient Position: Sitting, Cuff Size: Large)   Pulse 81   Ht '5\' 6"'$  (1.676 m)   Wt 245 lb 1.3 oz (111.2 kg)   SpO2 97%   BMI 39.56 kg/m   Patient alert and oriented and in no cardiopulmonary distress.  HEENT: No facial asymmetry, EOMI,     Neck supple .Left maxillary sinus tenderness Left Tm dull, reduced light reflex and mildly erythematous, bilateral cervical adenitis No exudate in oropharynx, no erythema  Chest: Clear to auscultation bilaterally.  CVS: S1, S2 no murmurs, no S3.Regular rate.  ABD: Soft non tender.   Ext: No edema   Psych: Good eye contact, normal affect. Memory intact not anxious or depressed appearing.  CNS: CN 2-12 intact, power,  normal throughout.no focal deficits noted.   Assessment & Plan  Left maxillary sinusitis Z pack pescribed  Right serous otitis media Z pack prescribed  Snoring Encouraged strongly to pursue sleep study so she can be treated  Sore throat Recent strep exposure, rapid strep test in office is negative

## 2022-04-25 DIAGNOSIS — H6501 Acute serous otitis media, right ear: Secondary | ICD-10-CM | POA: Insufficient documentation

## 2022-04-25 DIAGNOSIS — J029 Acute pharyngitis, unspecified: Secondary | ICD-10-CM | POA: Insufficient documentation

## 2022-04-25 NOTE — Assessment & Plan Note (Signed)
Encouraged strongly to pursue sleep study so she can be treated

## 2022-04-25 NOTE — Assessment & Plan Note (Signed)
Recent strep exposure, rapid strep test in office is negative

## 2022-04-25 NOTE — Assessment & Plan Note (Signed)
Z pack pescribed

## 2022-04-25 NOTE — Assessment & Plan Note (Signed)
Z pack prescribed 

## 2022-05-06 ENCOUNTER — Ambulatory Visit: Payer: 59 | Admitting: Family Medicine

## 2022-05-06 ENCOUNTER — Other Ambulatory Visit: Payer: Self-pay

## 2022-05-06 ENCOUNTER — Encounter: Payer: Self-pay | Admitting: Family Medicine

## 2022-05-06 VITALS — BP 122/87 | HR 81 | Ht 66.0 in | Wt 246.0 lb

## 2022-05-06 DIAGNOSIS — R634 Abnormal weight loss: Secondary | ICD-10-CM | POA: Diagnosis not present

## 2022-05-06 DIAGNOSIS — Z87898 Personal history of other specified conditions: Secondary | ICD-10-CM

## 2022-05-06 DIAGNOSIS — Z6839 Body mass index (BMI) 39.0-39.9, adult: Secondary | ICD-10-CM | POA: Diagnosis not present

## 2022-05-06 DIAGNOSIS — E6609 Other obesity due to excess calories: Secondary | ICD-10-CM | POA: Diagnosis not present

## 2022-05-06 NOTE — Patient Instructions (Signed)
It was pleasure meeting with you today. Follow up with your primary health provider if any health concerns arises.

## 2022-05-06 NOTE — Progress Notes (Signed)
Patient Office Visit   Subjective   Patient ID: Deborah Douglas, female    DOB: October 31, 1990  Age: 32 y.o. MRN: [id number]  CC:  Chief Complaint  Patient presents with   Weight Loss    Patient is here for weight loss f/u.     HPI Deborah Douglas 32 year old female, presents to clinic for weight loss management. Patient cites health as reasons for wanting to lose weight. She  has a past medical history of Breathing difficulty, GERD (gastroesophageal reflux disease), Irritable bowel syndrome, Migraine, Morbid obesity ([place]), Polycystic ovarian syndrome, and Urticaria.  As per our last visiti patient reports implementing weight loss plan of care we discussed. For healthy eating habits she limits fatty foods and sugars. For lunch she consumes 30 grams of protein shake with a whole wheat baked chicken sandwich. Dinner she consumes 1/2 cup of white rice 1/2 cup 1 cup of vegetables, brocoli, brussle sprouts, pan roasted chicken, Snacks consist of cheese, crackers , grapes , strawberries , oranges, skinny popcorn. Physical activity includes weight lighting 25 lbs dumbbells, walks 1 mile a day 3 times a week.     Obesity History Weight in late teens: 180 lb. Period of greatest weight gain: 46 lb during 32 years old Lowest adult weight: 204 Highest adult weight: 246 Amount of time at present weight: 246 lb.   History of Weight Loss Efforts Greatest amount of weight lost: 25 lb over 3 months Amount of time that loss was maintained: 3 months Circumstances associated with regain of weight: Pregnancy Successful weight loss techniques attempted: self-directed dieting and very low calorie diet Unsuccessful weight loss techniques attempted: very low calorie diet  Current Exercise Habits walking and weightlifting  Current Eating Habits Number of regular meals per day: 2 Number of snacking episodes per day: 2 Who shops for food? patient Who prepares food? patient Who eats with patient? patient and  husband Binge behavior?: no Purge behavior? no Anorexic behavior? no Eating precipitated by stress? yes  Guilt feelings associated with eating? yes   Other Potential Contributing Factors Use of alcohol: average 1 drinks/week Use of medications that may cause weight gain tricyclic antidepressants ( ) History of past abuse? none Psych History: anxiety and obesity  Outpatient Encounter Medications as of 05/06/2022  Medication Sig   doxycycline (VIBRAMYCIN) 100 MG capsule Take 100 mg by mouth daily.   escitalopram (LEXAPRO) 10 MG tablet Take 10 mg by mouth daily.   No facility-administered encounter medications on file as of 05/06/2022.    Past Surgical History:  Procedure Laterality Date   ADENOIDECTOMY     ESOPHAGOGASTRODUODENOSCOPY  05/26/2011   Procedure: ESOPHAGOGASTRODUODENOSCOPY (EGD);  Surgeon: Rogene Houston, MD;  [facility];  Service: Endoscopy;  Laterality: N/A;  300   KNEE ARTHROSCOPY     TONSILLECTOMY     TONSILLECTOMY AND ADENOIDECTOMY      Review of Systems  Constitutional:  Negative for chills and fever.  Gastrointestinal:  Negative for abdominal pain, nausea and vomiting.  Genitourinary:  Negative for dysuria, hematuria and urgency.      Objective    BP 122/87   Pulse 81   Ht 5' 6"$  (1.676 m)   Wt 246 lb (111.6 kg)   SpO2 98%   BMI 39.71 kg/m   Physical Exam Vitals reviewed.  Cardiovascular:     Rate and Rhythm: Normal rate and regular rhythm.     Pulses: Normal pulses.     Heart sounds: Normal  heart sounds.  Pulmonary:     Effort: Pulmonary effort is normal.     Breath sounds: Normal breath sounds.  Abdominal:     General: Bowel sounds are normal.     Palpations: Abdomen is soft. There is no mass.  Skin:    General: Skin is warm and dry.     Capillary Refill: Capillary refill takes less than 2 seconds.  Neurological:     General: No focal deficit present.     Mental Status: She is alert.  Psychiatric:        Mood and Affect:  Mood normal.       Assessment & Plan:  Weight loss  History of snoring -     Home sleep test  Class 2 obesity due to excess calories with body mass index (BMI) of 39.0 to 39.9 in adult, unspecified whether serious comorbidity present Assessment & Plan: As per our last visit patient gained 2 lbs current weight 246 lb BMI 39.71 Started patient on weight management plan Discussed the importance to start eating 3 meals a day including breakfast, drink 8 glasses of water a day ,reduce portion sizes. reduced carbohydrates limit saturated and trans fat, increase servings of vegetables and limit processed foods. Find an activity that you will enjoy and start to be active at least 5 days a week for 30 minutes each day. Keep a food journal or an activity journal to identify triggers that lead to emotional eating Follow up in 4 weeks for Weight loss management       Return in about 4 weeks (around 06/03/2022) for Weight Loss Mangment.   Renard Hamper Ria Comment, FNP

## 2022-05-08 NOTE — Assessment & Plan Note (Signed)
As per our last visit patient gained 2 lbs current weight 246 lb BMI 39.71 Started patient on weight management plan Discussed the importance to start eating 3 meals a day including breakfast, drink 8 glasses of water a day ,reduce portion sizes. reduced carbohydrates limit saturated and trans fat, increase servings of vegetables and limit processed foods. Find an activity that you will enjoy and start to be active at least 5 days a week for 30 minutes each day. Keep a food journal or an activity journal to identify triggers that lead to emotional eating Follow up in 4 weeks for Weight loss management

## 2022-05-14 ENCOUNTER — Telehealth: Payer: Self-pay | Admitting: Family Medicine

## 2022-05-14 NOTE — Telephone Encounter (Signed)
Patient was told to call office if she has not heard from the referral to [facility] Sleep study. Please return patient call.

## 2022-05-16 ENCOUNTER — Other Ambulatory Visit: Payer: Self-pay | Admitting: Family Medicine

## 2022-05-16 DIAGNOSIS — R0683 Snoring: Secondary | ICD-10-CM

## 2022-05-17 NOTE — Telephone Encounter (Signed)
Hello Tiffany let patient know I sent ambulatory referral

## 2022-05-17 NOTE — Telephone Encounter (Signed)
Spoke with patient.

## 2022-06-03 ENCOUNTER — Encounter: Payer: Self-pay | Admitting: Family Medicine

## 2022-06-03 ENCOUNTER — Ambulatory Visit: Payer: 59 | Admitting: Family Medicine

## 2022-06-03 VITALS — BP 122/82 | HR 87 | Ht 66.0 in | Wt 254.0 lb

## 2022-06-03 DIAGNOSIS — E041 Nontoxic single thyroid nodule: Secondary | ICD-10-CM

## 2022-06-03 DIAGNOSIS — Z6841 Body Mass Index (BMI) 40.0 and over, adult: Secondary | ICD-10-CM | POA: Diagnosis not present

## 2022-06-03 NOTE — Patient Instructions (Signed)
It was pleasure meeting with you today. Follow up with your primary health provider if any health concerns arises.  

## 2022-06-03 NOTE — Progress Notes (Signed)
New Patient Office Visit   Subjective   Patient ID: Deborah Douglas, female    DOB: [birth year]  Age: 32 y.o. MRN: :[id number]  CC:  Chief Complaint  Patient presents with   Weight Loss    Patient is here for weight loss f/u.     HPI Deborah Douglas 32 year old female, presents to clinic for weight loss management. She  has a past medical history of Breathing difficulty, GERD (gastroesophageal reflux disease), Irritable bowel syndrome, Migraine, Morbid obesity ([place]), Polycystic ovarian syndrome, and Urticaria.  HPI Patient is here for weight loss follow up healthy eating habits include: Breakfast reports fasting today and sometimes she eats two scrambles eggs only. Lunch includes grilled chicken, grilled beans, squash and mashed potatoes. Dinner patient reports eating [place] stuffed peppers.For physical activities patient is incorporating walking 2 miles a day, yoga sessions at home.Behavior mindset includes limiting her self from binge eating behaviors.  Outpatient Encounter Medications as of 06/03/2022  Medication Sig   doxycycline (VIBRAMYCIN) 100 MG capsule Take 100 mg by mouth daily.   escitalopram (LEXAPRO) 10 MG tablet Take 10 mg by mouth daily.   No facility-administered encounter medications on file as of 06/03/2022.    Past Surgical History:  Procedure Laterality Date   ADENOIDECTOMY     ESOPHAGOGASTRODUODENOSCOPY  05/26/2011   Procedure: ESOPHAGOGASTRODUODENOSCOPY (EGD);  Surgeon: Rogene Houston, MD;  [facility];  Service: Endoscopy;  Laterality: N/A;  300   KNEE ARTHROSCOPY     TONSILLECTOMY     TONSILLECTOMY AND ADENOIDECTOMY      Review of Systems  Constitutional:  Negative for chills and fever.  Respiratory:  Negative for shortness of breath.   Cardiovascular:  Negative for chest pain.  Gastrointestinal:  Negative for nausea and vomiting.  Neurological:  Negative for dizziness and headaches.      Objective    BP 122/82   Pulse 87   Ht '5\' 6"'$   (1.676 m)   Wt 254 lb (115.2 kg)   SpO2 98%   BMI 41.00 kg/m   Physical Exam Constitutional:      Appearance: She is obese.  Cardiovascular:     Rate and Rhythm: Normal rate.     Pulses: Normal pulses.  Pulmonary:     Effort: Pulmonary effort is normal.  Abdominal:     General: Bowel sounds are normal.     Palpations: Abdomen is soft. There is no mass.  Musculoskeletal:        General: Normal range of motion.     Cervical back: Normal range of motion and neck supple.  Skin:    General: Skin is warm and dry.     Capillary Refill: Capillary refill takes less than 2 seconds.  Neurological:     General: No focal deficit present.     Mental Status: She is alert.     Coordination: Coordination normal.     Gait: Gait normal.  Psychiatric:        Mood and Affect: Mood normal.       Assessment & Plan:  Thyroid nodule Assessment & Plan: Painful to swallow, and left small nodule  Thyroid US ordered   Orders: -     US THYROID; Future  BMI 40.0-44.9, adult (HCC) Assessment & Plan: Patient gained 8 lbs since last visit Explained to continue to follow diet low in saturated fat, low cab, high protein , reduce dietary salt intake, avoid fatty foods, maintain an exercise routine 3 to  5 days a week for a minimum total of 150 minutes.  Follow up in 4 weeks     Return in about 4 weeks (around 07/01/2022) for Weight Loss Mangment, Thyroid labs.   Renard Hamper Ria Comment, FNP

## 2022-06-03 NOTE — Assessment & Plan Note (Addendum)
Patient gained 8 lbs since last visit Explained to continue to follow diet low in saturated fat, low cab, high protein , reduce dietary salt intake, avoid fatty foods, maintain an exercise routine 3 to 5 days a week for a minimum total of 150 minutes.  Follow up in 4 weeks

## 2022-06-03 NOTE — Assessment & Plan Note (Signed)
Painful to swallow, and left small nodule  Thyroid US ordered

## 2022-06-11 ENCOUNTER — Ambulatory Visit (HOSPITAL_COMMUNITY)
Admission: RE | Admit: 2022-06-11 | Discharge: 2022-06-11 | Disposition: A | Discharge status: Home / Self Care | Payer: 59 | Source: Ambulatory Visit | Attending: Family Medicine | Admitting: Family Medicine

## 2022-06-11 DIAGNOSIS — E041 Nontoxic single thyroid nodule: Secondary | ICD-10-CM | POA: Insufficient documentation

## 2022-06-15 ENCOUNTER — Ambulatory Visit: Payer: 59 | Admitting: Neurology

## 2022-06-15 ENCOUNTER — Encounter: Payer: Self-pay | Admitting: Neurology

## 2022-06-15 VITALS — BP 122/82 | HR 73 | Ht 66.0 in | Wt 253.0 lb

## 2022-06-15 DIAGNOSIS — R0683 Snoring: Secondary | ICD-10-CM | POA: Diagnosis not present

## 2022-06-15 DIAGNOSIS — R635 Abnormal weight gain: Secondary | ICD-10-CM

## 2022-06-15 DIAGNOSIS — Z820 Family history of epilepsy and other diseases of the nervous system: Secondary | ICD-10-CM

## 2022-06-15 DIAGNOSIS — G4719 Other hypersomnia: Secondary | ICD-10-CM

## 2022-06-15 DIAGNOSIS — R351 Nocturia: Secondary | ICD-10-CM

## 2022-06-15 DIAGNOSIS — Z9189 Other specified personal risk factors, not elsewhere classified: Secondary | ICD-10-CM | POA: Diagnosis not present

## 2022-06-15 DIAGNOSIS — G4726 Circadian rhythm sleep disorder, shift work type: Secondary | ICD-10-CM | POA: Diagnosis not present

## 2022-06-15 NOTE — Patient Instructions (Signed)

## 2022-06-15 NOTE — Progress Notes (Addendum)
Subjective:    Patient ID: Deborah Douglas is a 32 y.o. female.  HPI    Star Age, MD, PhD [facility] [address]. [place], [place] [address]  Dear [name],  I saw your patient, Deborah Douglas, upon your kind request in my sleep clinic today for initial consultation of her sleep disorder, in particular, concern for underlying obstructive sleep apnea.  The patient is unaccompanied today.  As you know, [name] is a 32 year old female with an underlying medical history of reflux disease, PCOS, urticaria, migraine headaches, irritable bowel syndrome, and obesity, who reports snoring and excessive daytime somnolence.  Her Epworth sleepiness score is 6 out of 24, fatigue severity score is 40 out of 63. I reviewed your office note from 05/06/2022.  She has had snoring since about a year ago.  She has gained weight even after having her baby.  She has a 35-month-old son.  She lives with her significant other and her son, she works full-time as a Engineer, structural.  She works 4 days on and 4 days off.  When she works, she goes to bed around 3:30 AM and rise time is around 11 AM.  She works from 4 PM to 3 AM.  When she is off, she will go to bed around 10 PM and sleep till 8:30 AM.  She has nocturia about once per average night, denies recurrent nocturnal or morning headaches.  She has a family history of sleep apnea, both her father and her sister have CPAP machines.  She drinks caffeine in the form of coffee and energy drink, 1 or 2 servings per day.  She drinks alcohol occasionally, maybe once every 2 weeks, she is a non-smoker.  They have no pets in the household.   Her Past Medical History Is Significant For: Past Medical History:  Diagnosis Date   Breathing difficulty    bronchitis   GERD (gastroesophageal reflux disease)    pt states she no longer has this   Irritable bowel syndrome    Migraine    Morbid obesity ([place])    Polycystic ovarian syndrome     Urticaria    pt states this was when she had a reaction to an all natural hair product    Her Past Surgical History Is Significant For: Past Surgical History:  Procedure Laterality Date   CESAREAN SECTION  2023   CHOLECYSTECTOMY  2014   ESOPHAGOGASTRODUODENOSCOPY  05/26/2011   Procedure: ESOPHAGOGASTRODUODENOSCOPY (EGD);  Surgeon: Rogene Houston, MD;  [facility];  Service: Endoscopy;  Laterality: N/A;  300   KNEE ARTHROSCOPY Right 2010   left shoulder surgery Left 2015   TONSILLECTOMY AND ADENOIDECTOMY     <40 years old    Her Family History Is Significant For: Family History  Problem Relation Age of Onset   Thyroid disease Mother    GER disease Mother    Irritable bowel syndrome Mother    Addison's disease Mother    Allergic rhinitis Mother    Sleep apnea Mother    High blood pressure Mother    High blood pressure Father    GER disease Father    Thyroid disease Sister    Healthy Sister    Healthy Sister    Allergic rhinitis Sister    Sleep apnea Sister    Allergic rhinitis Maternal Grandmother    Angioedema Neg Hx    Asthma Neg Hx    Eczema Neg Hx  Immunodeficiency Neg Hx    Urticaria Neg Hx     Her Social History Is Significant For: Social History   Socioeconomic History   Marital status: Soil scientist    Spouse name: Not on file   Number of children: 1   Years of education: Not on file   Highest education level: Not on file  Occupational History   Not on file  Tobacco Use   Smoking status: Never   Smokeless tobacco: Never  Vaping Use   Vaping Use: Never used  Substance and Sexual Activity   Alcohol use: No   Drug use: No   Sexual activity: Never  Other Topics Concern   Not on file  Social History Narrative   Lives at home with partner and child   Right handed   Caffeine: 2-3 cups/day   Social Determinants of Health   Financial Resource Strain: Not on file  Food Insecurity: Not on file  Transportation Needs: Not on file   Physical Activity: Not on file  Stress: Not on file  Social Connections: Not on file    Her Allergies Are:  Allergies  Allergen Reactions   Isometheptene-Dichloral-Apap     Numbness: MIDRIN  :   Her Current Medications Are:  Outpatient Encounter Medications as of 06/15/2022  Medication Sig   doxycycline (VIBRAMYCIN) 100 MG capsule Take 100 mg by mouth daily.   escitalopram (LEXAPRO) 10 MG tablet Take 15 mg by mouth daily.   Prenatal Vit-Fe Fumarate-FA (PRENATAL PO) Take by mouth.   No facility-administered encounter medications on file as of 06/15/2022.  :   Review of Systems:  Out of a complete 14 point review of systems, all are reviewed and negative with the exception of these symptoms as listed below:  Review of Systems  Neurological:        Patient is here alone today for a sleep consult. She states she is tired all of the time. She states that when she was [redacted] weeks pregnant she had covid and started snoring after that even though she has never snored before. She states her father and sister have sleep apnea. She has never had a sleep study. ESS 6 FSS 40.     Objective:  Neurological Exam  Physical Exam Physical Examination:   Vitals:   06/15/22 1420  BP: 122/82  Pulse: 73    General Examination: The patient is a very pleasant 32 y.o. female in no acute distress. She appears well-developed and well-nourished and well groomed.   HEENT: Normocephalic, atraumatic, pupils are equal, round and reactive to light, extraocular tracking is good without limitation to gaze excursion or nystagmus noted. Hearing is grossly intact. Face is symmetric with normal facial animation. Speech is clear with no dysarthria noted. There is no hypophonia. There is no lip, neck/head, jaw or voice tremor. Neck is supple with full range of passive and active motion. There are no carotid bruits on auscultation. Oropharynx exam reveals: mild mouth dryness, good dental hygiene and moderate airway  crowding, due to small airway entry and elongated tongue, tonsils absent.  Mallampati class II.  Neck circumference 17-1/4 inches, mild overbite noted.  Tongue protrudes centrally and palate elevates symmetrically.  Chest: Clear to auscultation without wheezing, rhonchi or crackles noted.  Heart: S1+S2+0, regular and normal without murmurs, rubs or gallops noted.   Abdomen: Soft, non-tender and non-distended.  Extremities: There is no pitting edema in the distal lower extremities bilaterally.   Skin: Warm and dry without trophic changes noted.  Musculoskeletal: exam reveals no obvious joint deformities.   Neurologically:  Mental status: The patient is awake, alert and oriented in all 4 spheres. Her immediate and remote memory, attention, language skills and fund of knowledge are appropriate. There is no evidence of aphasia, agnosia, apraxia or anomia. Speech is clear with normal prosody and enunciation. Thought process is linear. Mood is normal and affect is normal.  Cranial nerves II - XII are as described above under HEENT exam.  Motor exam: Normal bulk, strength and tone is noted. There is no obvious action or resting tremor.  Fine motor skills and coordination: grossly intact.  Cerebellar testing: No dysmetria or intention tremor. There is no truncal or gait ataxia.  Sensory exam: intact to light touch in the upper and lower extremities.  Gait, station and balance: She stands easily. No veering to one side is noted. No leaning to one side is noted. Posture is age-appropriate and stance is narrow based. Gait shows normal stride length and normal pace. No problems turning are noted.   Assessment and Plan:  In summary, IVYROSE GIERKE is a very pleasant 32 y.o.-year old female with an underlying medical history of reflux disease, PCOS, urticaria, migraine headaches, irritable bowel syndrome, and obesity, whose history and physical exam are concerning for sleep disordered breathing,  particularly obstructive sleep apnea (OSA). A laboratory attended sleep study is typically considered "gold standard" for evaluation of sleep disordered breathing.   I had a long chat with the patient about my findings and the diagnosis of sleep apnea  particularly OSA, its prognosis and treatment options. We talked about medical/conservative treatments, surgical interventions and non-pharmacological approaches for symptom control. I explained, in particular, the risks and ramifications of untreated moderate to severe OSA, especially with respect to developing cardiovascular disease down the road, including congestive heart failure (CHF), difficult to treat hypertension, cardiac arrhythmias (particularly A-fib), neurovascular complications including TIA, stroke and dementia. Even type 2 diabetes has, in part, been linked to untreated OSA. Symptoms of untreated OSA may include (but may not be limited to) daytime sleepiness, nocturia (i.e. frequent nighttime urination), memory problems, mood irritability and suboptimally controlled or worsening mood disorder such as depression and/or anxiety, lack of energy, lack of motivation, physical discomfort, as well as recurrent headaches, especially morning or nocturnal headaches. We talked about the importance of maintaining a healthy lifestyle and striving for healthy weight. In addition, we talked about the importance of striving for and maintaining good sleep hygiene.  Shift work and shifting sleep schedule likely contributes to her daytime somnolence and sleep disturbance. I recommended a sleep study at this time. I outlined the differences between a laboratory attended sleep study which is considered more comprehensive and accurate over the option of a home sleep test (HST); the latter may lead to underestimation of sleep disordered breathing in some instances and does not help with diagnosing upper airway resistance syndrome and is not accurate enough to diagnose  primary central sleep apnea typically. I outlined possible surgical and non-surgical treatment options of OSA, including the use of a positive airway pressure (PAP) device (i.e. CPAP, AutoPAP/APAP or BiPAP in certain circumstances), a custom-made dental device (aka oral appliance, which would require a referral to a specialist dentist or orthodontist typically, and is generally speaking not considered for patients with full dentures or edentulous state), upper airway surgical options, such as traditional UPPP (which is not considered a first-line treatment) or the Inspire device (hypoglossal nerve stimulator, which would involve a referral for  consultation with an ENT surgeon, after careful selection, following inclusion criteria - also not first-line treatment). I explained the PAP treatment option to the patient in detail, as this is generally considered first-line treatment.  The patient indicated that she would be willing to try PAP therapy, if the need arises. I explained the importance of being compliant with PAP treatment, not only for insurance purposes but primarily to improve patient's symptoms symptoms, and for the patient's long term health benefit, including to reduce Her cardiovascular risks longer-term.    We will pick up our discussion about the next steps and treatment options after testing.  We will keep her posted as to the test results by phone call and/or MyChart messaging where possible.  We will plan to follow-up in sleep clinic accordingly as well.  I answered all her questions today and the patient was in agreement.   I encouraged her to call with any interim questions, concerns, problems or updates or email [place] through MyChart.  Generally speaking, sleep test authorizations may take up to 2 weeks, sometimes less, sometimes longer, the patient is encouraged to get in touch with [place] if they do not hear back from the sleep lab staff directly within the next 2 weeks.  Thank you very much  for allowing me to participate in the care of this nice patient. If I can be of any further assistance to you please do not hesitate to call me at [phone].  Sincerely,   Huston Foley, MD, PhD   Addendum, 06/28/2022: Home sleep test ordered as insurance denied laboratory attended sleep study.

## 2022-06-24 ENCOUNTER — Telehealth: Payer: Self-pay | Admitting: Neurology

## 2022-06-24 NOTE — Telephone Encounter (Signed)
[employer] pending uploaded notes on the portal  

## 2022-06-28 NOTE — Telephone Encounter (Signed)
[employer] denied the NPSG- would you like to order a HST?       

## 2022-06-28 NOTE — Telephone Encounter (Signed)
Noted, thank you. HST- [employer] no auth req.  

## 2022-06-28 NOTE — Addendum Note (Signed)
Addended by: Huston Foley on: 06/28/2022 07:31 AM   Modules accepted: Orders

## 2022-06-28 NOTE — Telephone Encounter (Signed)
HST ordered as an addendum to my office visit note.

## 2022-07-01 ENCOUNTER — Ambulatory Visit: Payer: 59 | Admitting: Family Medicine

## 2022-07-01 ENCOUNTER — Encounter: Payer: Self-pay | Admitting: Family Medicine

## 2022-07-01 VITALS — BP 110/77 | HR 77 | Ht 66.0 in | Wt 252.0 lb

## 2022-07-01 DIAGNOSIS — Z6841 Body Mass Index (BMI) 40.0 and over, adult: Secondary | ICD-10-CM | POA: Diagnosis not present

## 2022-07-01 DIAGNOSIS — Z6839 Body mass index (BMI) 39.0-39.9, adult: Secondary | ICD-10-CM | POA: Insufficient documentation

## 2022-07-01 DIAGNOSIS — Z1329 Encounter for screening for other suspected endocrine disorder: Secondary | ICD-10-CM | POA: Diagnosis not present

## 2022-07-01 DIAGNOSIS — M549 Dorsalgia, unspecified: Secondary | ICD-10-CM | POA: Insufficient documentation

## 2022-07-01 DIAGNOSIS — M545 Low back pain, unspecified: Secondary | ICD-10-CM | POA: Diagnosis not present

## 2022-07-01 MED ORDER — CYCLOBENZAPRINE HCL 5 MG PO TABS: 5.0000 mg | ORAL_TABLET | Freq: Three times a day (TID) | ORAL | 1 refills | Status: DC | PRN

## 2022-07-01 MED ORDER — SEMAGLUTIDE-WEIGHT MANAGEMENT 0.25 MG/0.5ML ~~LOC~~ SOAJ: 0.2500 mg | SUBCUTANEOUS | 0 refills | Status: DC

## 2022-07-01 NOTE — Assessment & Plan Note (Signed)
Flexeril 5 mg. Discussed medication desired effects, potential side effects. Non pharmacological interventions include rest, avoid twisting, improper bending, straining lower back. Demonstration of proper body mechanics. Alternate ice and heat. Recommend stretching back and legs. Follow up for worsening or persistent symptoms. Patient verbalizes understanding regarding plan of care and all questions answered.

## 2022-07-01 NOTE — Progress Notes (Signed)
Patient Office Visit   Subjective   Patient ID: Deborah Douglas, female    DOB: [birth year]  Age: 32 y.o. MRN: [id number]  CC:  Chief Complaint  Patient presents with   Weight Loss    Patient is here for weight loss f/u.    Back Pain    Patient complains of lower R back pinpoint pain starting 4 days ago that is increasingly worsening.     HPI Deborah Douglas 32 year old female presents to the clinic for weight loss management and back pain started 4 days ago. She  has a past medical history of Breathing difficulty, GERD (gastroesophageal reflux disease), Irritable bowel syndrome, Migraine, Morbid obesity, Polycystic ovarian syndrome, and Urticaria.  Patient is here for weight loss follow up healthy eating habits include: Breakfast patient reported drinking protein coffee. Lunch consist of ham and cheese sandwich on whole wheat bread with a greek yogurt. Dinner includes pork chops with mashed poatotes. For physical activities patient is incorporating walking 4 times a weeks for 2 miles.   Back Pain The current episode started in the past 7 days. The problem occurs constantly. The problem has been gradually worsening since onset. The pain is present in the lumbar spine. The quality of the pain is described as aching and stabbing. The pain does not radiate. The pain is at a severity of 5/10. The pain is The same all the time. The symptoms are aggravated by sitting, lying down, twisting and standing. Pertinent negatives include no abdominal pain, bladder incontinence, bowel incontinence, leg pain, numbness, pelvic pain or weakness. Risk factors include obesity, sedentary lifestyle and lack of exercise. She has tried ice, NSAIDs and analgesics for the symptoms.     Outpatient Encounter Medications as of 07/01/2022  Medication Sig   cyclobenzaprine (FLEXERIL) 5 MG tablet Take 1 tablet (5 mg total) by mouth 3 (three) times daily as needed for muscle spasms.   doxycycline (VIBRAMYCIN) 100 MG  capsule Take 100 mg by mouth daily.   escitalopram (LEXAPRO) 10 MG tablet Take 15 mg by mouth daily.   Prenatal Vit-Fe Fumarate-FA (PRENATAL PO) Take by mouth.   No facility-administered encounter medications on file as of 07/01/2022.    Past Surgical History:  Procedure Laterality Date   CESAREAN SECTION  2023   CHOLECYSTECTOMY  2014   ESOPHAGOGASTRODUODENOSCOPY  05/26/2011   Procedure: ESOPHAGOGASTRODUODENOSCOPY (EGD);  Surgeon: Malissa Hippo, MD;  [facility];  Service: Endoscopy;  Laterality: N/A;  300   KNEE ARTHROSCOPY Right 2010   left shoulder surgery Left 2015   TONSILLECTOMY AND ADENOIDECTOMY     <50 years old    Review of Systems  Constitutional:  Negative for chills and fever.  HENT:  Negative for ear pain.   Respiratory:  Negative for cough.   Cardiovascular:  Negative for chest pain.  Gastrointestinal:  Negative for abdominal pain, nausea and vomiting.  Genitourinary:  Negative for dysuria.  Musculoskeletal:  Positive for back pain.  Neurological:  Negative for dizziness, weakness and headaches.      Objective    BP 110/77   Pulse 77   Ht 5\' 6"  (1.676 m)   Wt 252 lb (114.3 kg)   SpO2 96%   BMI 40.67 kg/m   Physical Exam Vitals reviewed.  Constitutional:      General: She is not in acute distress.    Appearance: Normal appearance. She is obese. She is not ill-appearing, toxic-appearing or diaphoretic.  Eyes:  General:        Right eye: No discharge.        Left eye: No discharge.     Conjunctiva/sclera: Conjunctivae normal.  Cardiovascular:     Rate and Rhythm: Normal rate and regular rhythm.     Heart sounds: Normal heart sounds.  Pulmonary:     Effort: Pulmonary effort is normal. No respiratory distress.     Breath sounds: Normal breath sounds.  Musculoskeletal:     Cervical back: Normal range of motion.     Lumbar back: No swelling, edema, deformity, signs of trauma, tenderness or bony tenderness. Normal range of motion.  Positive right straight leg raise test. Negative left straight leg raise test.  Skin:    General: Skin is warm and dry.  Neurological:     General: No focal deficit present.     Mental Status: She is alert and oriented to person, place, and time. Mental status is at baseline.  Psychiatric:        Mood and Affect: Mood normal.        Behavior: Behavior normal.        Thought Content: Thought content normal.        Judgment: Judgment normal.       Assessment & Plan:  Acute bilateral low back pain, unspecified whether sciatica present Assessment & Plan: Flexeril 5 mg. Discussed medication desired effects, potential side effects. Non pharmacological interventions include rest, avoid twisting, improper bending, straining lower back. Demonstration of proper body mechanics. Alternate ice and heat. Recommend stretching back and legs. Follow up for worsening or persistent symptoms. Patient verbalizes understanding regarding plan of care and all questions answered.   Orders: -     Cyclobenzaprine HCl; Take 1 tablet (5 mg total) by mouth 3 (three) times daily as needed for muscle spasms.  Dispense: 30 tablet; Refill: 1  Screening for thyroid disorder -     TSH + free T4  Class 3 severe obesity due to excess calories with serious comorbidity and body mass index (BMI) of 40.0 to 44.9 in adult Assessment & Plan: Started Wegovy 0.25 mg once weekly Follow up in 4 weeks LDL 109 not at goal Discussed diet which includes vegetables,fruits,whole grains, fat free or low fat diary,fish,poultry,beans,nuts and seeds,vegetable oils. Find an activity that you will enjoy and start to be active at least 5 days a week for 30 minutes each day.       Return in about 4 weeks (around 07/29/2022) for Weight Loss Mangment.   Deborah Lederer Newman Nip, FNP

## 2022-07-01 NOTE — Assessment & Plan Note (Signed)
Started Wegovy 0.25 mg once weekly Follow up in 4 weeks LDL 109 not at goal Discussed diet which includes vegetables,fruits,whole grains, fat free or low fat diary,fish,poultry,beans,nuts and seeds,vegetable oils. Find an activity that you will enjoy and start to be active at least 5 days a week for 30 minutes each day.

## 2022-07-01 NOTE — Patient Instructions (Signed)
It was pleasure meeting with you today. Please take medications as prescribed. Follow up with your primary health provider if any health concerns arises. If symptoms worsen please contact your primary care provider and/or visit the emergency department.  

## 2022-07-06 LAB — TSH+FREE T4
Free T4: 1.08 ng/dL (ref 0.82–1.77)
TSH: 3.61 u[IU]/mL (ref 0.450–4.500)

## 2022-07-14 ENCOUNTER — Encounter: Payer: Self-pay | Admitting: Family Medicine

## 2022-07-16 ENCOUNTER — Telehealth: Payer: Self-pay | Admitting: Family Medicine

## 2022-07-16 ENCOUNTER — Other Ambulatory Visit: Payer: Self-pay

## 2022-07-16 MED ORDER — SEMAGLUTIDE-WEIGHT MANAGEMENT 0.25 MG/0.5ML ~~LOC~~ SOAJ: 0.2500 mg | SUBCUTANEOUS | 0 refills | Status: AC

## 2022-07-16 NOTE — Telephone Encounter (Signed)
Medication has been sent. My chart message has been sent to pt.

## 2022-07-16 NOTE — Telephone Encounter (Signed)
Patient called asked to send this medicine wegovy send to [facility] located at [address] in [place], [place] and have them mail the prescription to

## 2022-07-28 ENCOUNTER — Ambulatory Visit (INDEPENDENT_AMBULATORY_CARE_PROVIDER_SITE_OTHER): Payer: 59 | Admitting: Neurology

## 2022-07-28 DIAGNOSIS — R351 Nocturia: Secondary | ICD-10-CM

## 2022-07-28 DIAGNOSIS — R635 Abnormal weight gain: Secondary | ICD-10-CM

## 2022-07-28 DIAGNOSIS — Z9189 Other specified personal risk factors, not elsewhere classified: Secondary | ICD-10-CM

## 2022-07-28 DIAGNOSIS — Z820 Family history of epilepsy and other diseases of the nervous system: Secondary | ICD-10-CM

## 2022-07-28 DIAGNOSIS — G4733 Obstructive sleep apnea (adult) (pediatric): Secondary | ICD-10-CM

## 2022-07-28 DIAGNOSIS — G4719 Other hypersomnia: Secondary | ICD-10-CM

## 2022-07-28 DIAGNOSIS — G4726 Circadian rhythm sleep disorder, shift work type: Secondary | ICD-10-CM

## 2022-07-28 DIAGNOSIS — R0683 Snoring: Secondary | ICD-10-CM

## 2022-08-04 NOTE — Procedures (Signed)
   [facility]  HOME SLEEP TEST (Watch PAT) REPORT  -  Mail-out Device  STUDY DATE: 08/02/2022  DOB: 04/10/1990  MRN: [id number]  ORDERING CLINICIAN: Huston Foley, MD, PhD   REFERRING CLINICIAN: Del Newman Nip, Tenna Child, FNP   CLINICAL INFORMATION/HISTORY: 32 year old female with an underlying medical history of reflux disease, PCOS, urticaria, migraine headaches, irritable bowel syndrome, and obesity, who reports snoring and excessive daytime somnolence.   Epworth sleepiness score: 6/24.  BMI: 40.7 kg/m  FINDINGS:   Sleep Summary:   Total Recording Time (hours, min): 9 hours, 20 min  Total Sleep Time (hours, min):  7 hours, 31 min  Percent REM (%):    18.2%   Respiratory Indices:   Calculated pAHI (per hour):  12.4/hour         REM pAHI:    18.5/hour       NREM pAHI: 11/hour  Central pAHI: 0.8/hour  Oxygen Saturation Statistics:    Oxygen Saturation (%) Mean: 95%   Minimum oxygen saturation (%):                 85%   O2 Saturation Range (%): 85 - 99%    O2 Saturation (minutes) <=88%: 0.6 min  Pulse Rate Statistics:   Pulse Mean (bpm):    81/min    Pulse Range (63 - 118/min)   IMPRESSION: OSA (obstructive sleep apnea), mild   RECOMMENDATION:  This home sleep test demonstrates overall mild obstructive sleep apnea with a total AHI of 12.4/hour and O2 nadir of 85%. Snoring was detected, ranging from mild to loud. Given the patient's medical history and sleep related complaints, therapy with a  positive airway pressure device is a reasonable first-line choice and clinically recommended. Treatment can be achieved in the form of autoPAP trial/titration at home for now. A full night, in-lab PAP titration study may aid in improving proper treatment settings and with mask fit, if needed, down the road. Alternative treatments may include weight loss (where appropriate) along with avoidance of the supine sleep position (if possible), or an oral  appliance in appropriate candidates.   Please note that untreated obstructive sleep apnea may carry additional perioperative morbidity. Patients with significant obstructive sleep apnea should receive perioperative PAP therapy and the surgeons and particularly the anesthesiologist should be informed of the diagnosis and the severity of the sleep disordered breathing. The patient should be cautioned not to drive, work at heights, or operate dangerous or heavy equipment when tired or sleepy. Review and reiteration of good sleep hygiene measures should be pursued with any patient. Other causes of the patient's symptoms, including circadian rhythm disturbances, an underlying mood disorder, medication effect and/or an underlying medical problem cannot be ruled out based on this test. Clinical correlation is recommended.  The patient and her referring provider will be notified of the test results. The patient will be seen in follow up in sleep clinic at [facility], as necessary.  I certify that I have reviewed the raw data recording prior to the issuance of this report in accordance with the standards of the American Academy of Sleep Medicine (AASM).  INTERPRETING PHYSICIAN:   Huston Foley, MD, PhD Medical Director, [facility]) Diplomat, ABPN (Neurology and Sleep)   [facility] [address], [address] [place], [place] [address] [phone]

## 2022-08-04 NOTE — Addendum Note (Signed)
Addended by: Huston Foley on: 08/04/2022 05:19 PM   Modules accepted: Orders

## 2022-08-04 NOTE — Progress Notes (Signed)
See procedure note.

## 2022-08-05 ENCOUNTER — Telehealth: Payer: Self-pay

## 2022-08-05 NOTE — Telephone Encounter (Addendum)
IF PATIENT CALLS BACK ROUTE TO POD 4    Contacted pt regarding sleep study, LVM rq a call back.     

## 2022-08-05 NOTE — Telephone Encounter (Signed)
-----   Message from Huston Foley, MD sent at 08/04/2022  5:19 PM EDT ----- Patient referred by PCP NP, seen by me on 06/15/2022, HST 08/02/2022.    Please call and notify the patient that the recent home sleep test showed obstructive sleep apnea. OSA is overall mild, but worth treating to see if she feels better after treatment. To that end I recommend treatment for this in the form of autoPAP, which means, that we don't have to bring her in for a sleep study with CPAP, but will let her try an autoPAP machine at home, through a DME company (of her choice, or as per insurance requirement). The DME representative will educate her on how to use the machine, how to put the mask on, etc. I have placed an order in the chart. Please send referral, talk to patient, send report to referring MD. We will need a FU in sleep clinic for 10 weeks post-PAP set up, please arrange that with me or one of our NPs. Thanks,   Huston Foley, MD, PhD [facility])

## 2022-08-05 NOTE — Telephone Encounter (Signed)
I called pt back. I advised pt that Dr. Frances Furbish reviewed their sleep study results and found that pt has mild OSA. Dr. Frances Furbish recommends that pt starts autopap. I reviewed PAP compliance expectations with the pt. Pt is agreeable to starting a CPAP. I advised pt that an order will be sent to a DME, [facility], and [facility] will call the pt within about one week after they file with the pt's insurance. [facility] will show the pt how to use the machine, fit for masks, and troubleshoot the CPAP if needed. A follow up appt was made for insurance purposes with Dr. Frances Furbish  on 8/8 915. Pt verbalized understanding to arrive 15 minutes early and bring their CPAP. Pt verbalized understanding of results. Pt had no questions at this time but was encouraged to call back if questions arise. I have sent the order to [facility] and have received confirmation that they have received the order.

## 2022-08-05 NOTE — Telephone Encounter (Signed)
Pt returned call. Please call back when available. 

## 2022-08-17 ENCOUNTER — Ambulatory Visit: Payer: 59 | Admitting: Family Medicine

## 2022-08-17 ENCOUNTER — Encounter: Payer: Self-pay | Admitting: Family Medicine

## 2022-08-17 VITALS — BP 132/86 | HR 87 | Ht 66.0 in | Wt 255.0 lb

## 2022-08-17 DIAGNOSIS — Z6841 Body Mass Index (BMI) 40.0 and over, adult: Secondary | ICD-10-CM | POA: Diagnosis not present

## 2022-08-17 DIAGNOSIS — R053 Chronic cough: Secondary | ICD-10-CM

## 2022-08-17 MED ORDER — SEMAGLUTIDE-WEIGHT MANAGEMENT 0.5 MG/0.5ML ~~LOC~~ SOAJ: 0.5000 mg | SUBCUTANEOUS | 0 refills | Status: AC

## 2022-08-17 MED ORDER — BENZONATATE 100 MG PO CAPS: 100.0000 mg | ORAL_CAPSULE | Freq: Two times a day (BID) | ORAL | 0 refills | Status: DC | PRN

## 2022-08-17 MED ORDER — LEVOCETIRIZINE DIHYDROCHLORIDE 5 MG PO TABS: 5.0000 mg | ORAL_TABLET | Freq: Every evening | ORAL | 1 refills | Status: DC

## 2022-08-17 MED ORDER — GUAIFENESIN 100 MG/5ML PO LIQD: 5.0000 mL | ORAL | 0 refills | Status: DC | PRN

## 2022-08-17 NOTE — Progress Notes (Signed)
Patient Office Visit   Subjective   Patient ID: Deborah Douglas, female    DOB: [birth year]  Age: 32 y.o. MRN: [id number]  CC:  Chief Complaint  Patient presents with   Weight Loss    Patient is here for weight loss f/u.    Cough    Patient complains of cough for 3 weeks.     HPI Deborah Douglas 32 year old, presents to the clinic for weight loss management. She  has a past medical history of Breathing difficulty, GERD (gastroesophageal reflux disease), Irritable bowel syndrome, Migraine, Morbid obesity (HCC), Polycystic ovarian syndrome, and Urticaria. For the details of today's visit, please refer to assessment and plan.  HPI    Outpatient Encounter Medications as of 08/17/2022  Medication Sig   benzonatate (TESSALON) 100 MG capsule Take 1 capsule (100 mg total) by mouth 2 (two) times daily as needed for cough.   cyclobenzaprine (FLEXERIL) 5 MG tablet Take 1 tablet (5 mg total) by mouth 3 (three) times daily as needed for muscle spasms.   doxycycline (VIBRAMYCIN) 100 MG capsule Take 100 mg by mouth daily.   escitalopram (LEXAPRO) 10 MG tablet Take 15 mg by mouth daily.   guaiFENesin (ROBITUSSIN) 100 MG/5ML liquid Take 5 mLs by mouth every 4 (four) hours as needed for cough or to loosen phlegm.   levocetirizine (XYZAL) 5 MG tablet Take 1 tablet (5 mg total) by mouth every evening.   Prenatal Vit-Fe Fumarate-FA (PRENATAL PO) Take by mouth.   [START ON 09/15/2022] Semaglutide-Weight Management 0.5 MG/0.5ML SOAJ Inject 0.5 mg into the skin once a week for 28 days.   No facility-administered encounter medications on file as of 08/17/2022.    Past Surgical History:  Procedure Laterality Date   CESAREAN SECTION  2023   CHOLECYSTECTOMY  2014   ESOPHAGOGASTRODUODENOSCOPY  05/26/2011   Procedure: ESOPHAGOGASTRODUODENOSCOPY (EGD);  Surgeon: Malissa Hippo, MD;  [facility];  Service: Endoscopy;  Laterality: N/A;  300   KNEE ARTHROSCOPY Right 2010   left shoulder surgery Left  2015   TONSILLECTOMY AND ADENOIDECTOMY     <32 years old    Review of Systems  Constitutional:  Negative for chills and fever.  Eyes:  Negative for blurred vision.  Respiratory:  Negative for shortness of breath.   Cardiovascular:  Negative for chest pain.  Gastrointestinal:  Negative for abdominal pain.  Genitourinary:  Negative for dysuria.  Musculoskeletal:  Negative for myalgias.  Neurological:  Negative for dizziness and headaches.      Objective    BP 132/86   Pulse 87   Ht 5\' 6"  (1.676 m)   Wt 255 lb (115.7 kg)   SpO2 96%   BMI 41.16 kg/m   Physical Exam Vitals reviewed.  Constitutional:      General: She is not in acute distress.    Appearance: Normal appearance. She is not ill-appearing, toxic-appearing or diaphoretic.  HENT:     Head: Normocephalic.  Eyes:     General:        Right eye: No discharge.        Left eye: No discharge.     Conjunctiva/sclera: Conjunctivae normal.  Cardiovascular:     Rate and Rhythm: Normal rate.     Pulses: Normal pulses.     Heart sounds: Normal heart sounds.  Pulmonary:     Effort: Pulmonary effort is normal. No respiratory distress.     Breath sounds: Normal breath sounds.  Abdominal:  General: Bowel sounds are normal.     Palpations: Abdomen is soft.     Tenderness: There is no abdominal tenderness. There is no guarding.  Musculoskeletal:        General: Normal range of motion.     Cervical back: Normal range of motion.  Skin:    General: Skin is warm and dry.     Capillary Refill: Capillary refill takes less than 2 seconds.  Neurological:     General: No focal deficit present.     Mental Status: She is alert and oriented to person, place, and time.     Coordination: Coordination normal.     Gait: Gait normal.  Psychiatric:        Mood and Affect: Mood normal.        Behavior: Behavior normal.       Assessment & Plan:  Class 3 obesity (HCC) -     Semaglutide-Weight Management; Inject 0.5 mg into the skin  once a week for 28 days.  Dispense: 2 mL; Refill: 0  Chronic cough -     Benzonatate; Take 1 capsule (100 mg total) by mouth 2 (two) times daily as needed for cough.  Dispense: 20 capsule; Refill: 0 -     guaiFENesin; Take 5 mLs by mouth every 4 (four) hours as needed for cough or to loosen phlegm.  Dispense: 120 mL; Refill: 0  Class 3 severe obesity due to excess calories with serious comorbidity and body mass index (BMI) of 40.0 to 44.9 in adult [facility]) Assessment & Plan: Increased Wegovy to 0.5 mg once weekly Follow up in 4 weeks Continued discussion adhering to weight loss plan, with strong emphasize on nutrition and exercise. Read food labels to know how many calories are in each serving , Increase water intake 2-3 L a day. Include more protein intake such as lean meat, poultry, fish. Increase fiber intake such as vegetables, whole grains, fruits, artichokes, green peas, broccoli, lentils and lima beans.    Other orders -     Levocetirizine Dihydrochloride; Take 1 tablet (5 mg total) by mouth every evening.  Dispense: 30 tablet; Refill: 1    Return in about 5 weeks (around 09/21/2022) for Weight Loss Mangment.   Cruzita Lederer Newman Nip, FNP

## 2022-08-17 NOTE — Assessment & Plan Note (Signed)
Increased Wegovy to 0.5 mg once weekly Follow up in 4 weeks Continued discussion adhering to weight loss plan, with strong emphasize on nutrition and exercise. Read food labels to know how many calories are in each serving , Increase water intake 2-3 L a day. Include more protein intake such as lean meat, poultry, fish. Increase fiber intake such as vegetables, whole grains, fruits, artichokes, green peas, broccoli, lentils and lima beans.

## 2022-08-17 NOTE — Patient Instructions (Signed)

## 2022-09-21 ENCOUNTER — Encounter: Payer: Self-pay | Admitting: Family Medicine

## 2022-09-21 ENCOUNTER — Ambulatory Visit: Payer: 59 | Admitting: Family Medicine

## 2022-09-21 VITALS — BP 115/73 | HR 81 | Ht 66.0 in | Wt 255.0 lb

## 2022-09-21 DIAGNOSIS — Z6841 Body Mass Index (BMI) 40.0 and over, adult: Secondary | ICD-10-CM | POA: Diagnosis not present

## 2022-09-21 DIAGNOSIS — Z713 Dietary counseling and surveillance: Secondary | ICD-10-CM | POA: Diagnosis not present

## 2022-09-21 MED ORDER — SEMAGLUTIDE-WEIGHT MANAGEMENT 1 MG/0.5ML ~~LOC~~ SOAJ: 1.0000 mg | SUBCUTANEOUS | 0 refills | Status: DC

## 2022-09-21 NOTE — Assessment & Plan Note (Signed)
Increased Wegovy 1mg  once a week injection Continued discussion adhering to weight loss plan, with strong emphasize on nutrition and exercise. Read food labels to know how many calories are in each serving , Increase water intake 2-3 L a day. Include more protein intake such as lean meat, poultry, fish. Increase fiber intake such as vegetables, whole grains, fruits, artichokes, green peas, broccoli, lentils and lima beans.

## 2022-09-21 NOTE — Patient Instructions (Signed)
        Great to see you today.   - Please take medications as prescribed. - Follow up with your primary health provider if any health concerns arises. - If symptoms worsen please contact your primary care provider and/or visit the emergency department.  

## 2022-09-21 NOTE — Progress Notes (Signed)
Patient Office Visit   Subjective   Patient ID: Deborah Douglas, female    DOB: March 30, 1990  Age: 32 y.o. MRN: [id number]  CC:  Chief Complaint  Patient presents with   Weight Loss    Patient is here for weight loss f/u. No changes or concerns since last visit.     HPI Deborah Douglas 32 year old female, presents to the clinic for weight loss management. She  has a past medical history of Breathing difficulty, GERD (gastroesophageal reflux disease), Irritable bowel syndrome, Migraine, Morbid obesity (HCC), Polycystic ovarian syndrome, and Urticaria.For the details of today's visit, please refer to assessment and plan.   HPI    Outpatient Encounter Medications as of 09/21/2022  Medication Sig   benzonatate (TESSALON) 100 MG capsule Take 1 capsule (100 mg total) by mouth 2 (two) times daily as needed for cough.   cyclobenzaprine (FLEXERIL) 5 MG tablet Take 1 tablet (5 mg total) by mouth 3 (three) times daily as needed for muscle spasms.   doxycycline (VIBRAMYCIN) 100 MG capsule Take 100 mg by mouth daily.   escitalopram (LEXAPRO) 10 MG tablet Take 15 mg by mouth daily.   guaiFENesin (ROBITUSSIN) 100 MG/5ML liquid Take 5 mLs by mouth every 4 (four) hours as needed for cough or to loosen phlegm.   levocetirizine (XYZAL) 5 MG tablet Take 1 tablet (5 mg total) by mouth every evening.   Prenatal Vit-Fe Fumarate-FA (PRENATAL PO) Take by mouth.   Semaglutide-Weight Management 0.5 MG/0.5ML SOAJ Inject 0.5 mg into the skin once a week for 28 days.   [START ON 11/18/2022] Semaglutide-Weight Management 1 MG/0.5ML SOAJ Inject 1 mg into the skin once a week for 28 days.   No facility-administered encounter medications on file as of 09/21/2022.    Past Surgical History:  Procedure Laterality Date   CESAREAN SECTION  2023   CHOLECYSTECTOMY  2014   ESOPHAGOGASTRODUODENOSCOPY  05/26/2011   Procedure: ESOPHAGOGASTRODUODENOSCOPY (EGD);  Surgeon: Malissa Hippo, MD;  [facility];  Service:  Endoscopy;  Laterality: N/A;  300   KNEE ARTHROSCOPY Right 2010   left shoulder surgery Left 2015   TONSILLECTOMY AND ADENOIDECTOMY     <69 years old    Review of Systems  Constitutional:  Negative for chills and fever.  Respiratory:  Negative for shortness of breath.   Cardiovascular:  Negative for chest pain.  Gastrointestinal:  Negative for abdominal pain.  Genitourinary:  Negative for dysuria.  Neurological:  Negative for dizziness and headaches.      Objective    BP 115/73   Pulse 81   Ht 5\' 6"  (1.676 m)   Wt 255 lb (115.7 kg)   SpO2 96%   BMI 41.16 kg/m   Physical Exam Vitals reviewed.  Constitutional:      General: She is not in acute distress.    Appearance: Normal appearance. She is not ill-appearing, toxic-appearing or diaphoretic.  HENT:     Head: Normocephalic.  Eyes:     General:        Right eye: No discharge.        Left eye: No discharge.     Conjunctiva/sclera: Conjunctivae normal.  Cardiovascular:     Rate and Rhythm: Normal rate.     Pulses: Normal pulses.     Heart sounds: Normal heart sounds.  Pulmonary:     Effort: Pulmonary effort is normal. No respiratory distress.     Breath sounds: Normal breath sounds.  Abdominal:  General: Bowel sounds are normal.     Palpations: Abdomen is soft.     Tenderness: There is no abdominal tenderness. There is no right CVA tenderness, left CVA tenderness or guarding.  Musculoskeletal:        General: Normal range of motion.     Cervical back: Normal range of motion.  Skin:    General: Skin is warm and dry.     Capillary Refill: Capillary refill takes less than 2 seconds.  Neurological:     General: No focal deficit present.     Mental Status: She is alert and oriented to person, place, and time.     Coordination: Coordination normal.     Gait: Gait normal.  Psychiatric:        Mood and Affect: Mood normal.       Assessment & Plan:  BMI 40.0-44.9, adult [facility]) Assessment & Plan: Increased  Wegovy 1mg  once a week injection Continued discussion adhering to weight loss plan, with strong emphasize on nutrition and exercise. Read food labels to know how many calories are in each serving , Increase water intake 2-3 L a day. Include more protein intake such as lean meat, poultry, fish. Increase fiber intake such as vegetables, whole grains, fruits, artichokes, green peas, broccoli, lentils and lima beans.    Other orders -     Semaglutide-Weight Management; Inject 1 mg into the skin once a week for 28 days.  Dispense: 2 mL; Refill: 0    Return in about 2 months (around 11/22/2022) for Weight Loss Mangment.   Cruzita Lederer Newman Nip, FNP

## 2022-10-18 ENCOUNTER — Encounter: Payer: Self-pay | Admitting: Family Medicine

## 2022-10-18 ENCOUNTER — Other Ambulatory Visit: Payer: Self-pay | Admitting: Family Medicine

## 2022-10-18 MED ORDER — SEMAGLUTIDE-WEIGHT MANAGEMENT 1.7 MG/0.75ML ~~LOC~~ SOAJ: 1.7000 mg | SUBCUTANEOUS | 0 refills | Status: DC

## 2022-10-23 ENCOUNTER — Encounter: Payer: Self-pay | Admitting: Family Medicine

## 2022-10-28 ENCOUNTER — Encounter: Payer: Self-pay | Admitting: Neurology

## 2022-10-28 ENCOUNTER — Ambulatory Visit: Payer: 59 | Admitting: Neurology

## 2022-10-28 VITALS — BP 125/80 | HR 78 | Ht 66.0 in | Wt 252.0 lb

## 2022-10-28 DIAGNOSIS — G4733 Obstructive sleep apnea (adult) (pediatric): Secondary | ICD-10-CM

## 2022-10-28 NOTE — Progress Notes (Signed)
Subjective:    Patient ID: Deborah Douglas is a 32 y.o. female.  HPI    Interim history:   Deborah Douglas is a 32 year old female with an underlying medical history of reflux disease, PCOS, urticaria, migraine headaches, irritable bowel syndrome, and obesity, who presents for follow-up consultation of obstructive sleep apnea after interim testing and starting home AutoPap therapy.  The patient is unaccompanied today.  I first met her at the request of her primary care provider on 06/15/2022, at which time she reported snoring and excessive daytime somnolence.  She was advised to proceed with a sleep study.  She had a home sleep test on 08/02/2022 which indicated overall mild obstructive sleep apnea with a total AHI of 12.4/hour and O2 nadir of 85%. Snoring was detected, ranging from mild to loud.  She was encouraged to try home AutoPap therapy.  Her set update was 08/26/22. She has a ResMed air sense 10 AutoSet machine, her DME provider is [facility].  Today, 10/28/2022: I reviewed her AutoPap compliance data from 10/28/2022 through 10/26/2022, which is a total of 30 days, during which time she used her machine every night with percent usage days greater than 4 hours at 97%, indicating excellent compliance with an average usage of 8 hours and 45 minutes, residual AHI borderline but at goal at 4.6/h, average pressure for the 95th percentile at 11.5 cm with a range of 5 to 12 cm with EPR of 3.  Leaks acceptable with a 95th percentile at 14.8 L/min.  She reports doing well, she is adjusted to treatment and feels like her sleep is more consolidated, she does not tend to wake up as many times in the middle of the night.  She reports that her significant other has noted that she snores very little and is less restless.  Nevertheless, she does report still some daytime tiredness, Epworth sleep score is 7 out of 24, fatigue score is 43 out of 63.  She is working on weight loss.  She has started [facility] for her PCOS and has been on  it for about 6 weeks now, she has lost a little bit of weight.  She is very motivated to continue with her AutoPap.  She uses a fullface mask with good tolerance.  The patient's allergies, current medications, family history, past medical history, past social history, past surgical history and problem list were reviewed and updated as appropriate.   Previously:   06/15/22: (She) reports snoring and excessive daytime somnolence.  Her Epworth sleepiness score is 6 out of 24, fatigue severity score is 40 out of 63. I reviewed your office note from 05/06/2022.  She has had snoring since about a year ago.  She has gained weight even after having her baby.  She has a 68-month-old son.  She lives with her significant other and her son, she works full-time as a Emergency planning/management officer.  She works 4 days on and 4 days off.  When she works, she goes to bed around 3:30 AM and rise time is around 11 AM.  She works from 4 PM to 3 AM.  When she is off, she will go to bed around 10 PM and sleep till 8:30 AM.  She has nocturia about once per average night, denies recurrent nocturnal or morning headaches.  She has a family history of sleep apnea, both her father and her sister have CPAP machines.  She drinks caffeine in the form of coffee and energy drink, 1 or 2 servings per  day.  She drinks alcohol occasionally, maybe once every 2 weeks, she is a non-smoker.  They have no pets in the household.    Her Past Medical History Is Significant For: Past Medical History:  Diagnosis Date   Breathing difficulty    bronchitis   GERD (gastroesophageal reflux disease)    pt states she no longer has this   Irritable bowel syndrome    Migraine    Morbid obesity (HCC)    Polycystic ovarian syndrome    Urticaria    pt states this was when she had a reaction to an all natural hair product    Her Past Surgical History Is Significant For: Past Surgical History:  Procedure Laterality Date   CESAREAN SECTION  2023   CHOLECYSTECTOMY   2014   ESOPHAGOGASTRODUODENOSCOPY  05/26/2011   Procedure: ESOPHAGOGASTRODUODENOSCOPY (EGD);  Surgeon: Malissa Hippo, MD;  [facility];  Service: Endoscopy;  Laterality: N/A;  300   KNEE ARTHROSCOPY Right 2010   left shoulder surgery Left 2015   TONSILLECTOMY AND ADENOIDECTOMY     <66 years old    Her Family History Is Significant For: Family History  Problem Relation Age of Onset   Thyroid disease Mother    GER disease Mother    Irritable bowel syndrome Mother    Addison's disease Mother    Allergic rhinitis Mother    Sleep apnea Mother    High blood pressure Mother    High blood pressure Father    GER disease Father    Thyroid disease Sister    Healthy Sister    Healthy Sister    Allergic rhinitis Sister    Sleep apnea Sister    Allergic rhinitis Maternal Grandmother    Angioedema Neg Hx    Asthma Neg Hx    Eczema Neg Hx    Immunodeficiency Neg Hx    Urticaria Neg Hx     Her Social History Is Significant For: Social History   Socioeconomic History   Marital status: Media planner    Spouse name: Not on file   Number of children: 1   Years of education: Not on file   Highest education level: Not on file  Occupational History   Not on file  Tobacco Use   Smoking status: Never   Smokeless tobacco: Never  Vaping Use   Vaping status: Never Used  Substance and Sexual Activity   Alcohol use: No   Drug use: No   Sexual activity: Never  Other Topics Concern   Not on file  Social History Narrative   Lives at home with partner and child   Right handed   Caffeine: 2-3 cups/day   Social Determinants of Health   Financial Resource Strain: Low Risk  (07/21/2021)   Received from [facility], [facility]   Overall Financial Resource Strain (CARDIA)    Difficulty of Paying Living Expenses: Not hard at all  Food Insecurity: Not on file  Transportation Needs: Not on file  Physical Activity: Not on file  Stress: No Stress Concern Present (07/21/2021)    Received from [facility], [facility]   [employer] [facility] - Occupational Stress Questionnaire    Feeling of Stress : Not at all  Social Connections: Unknown (07/20/2021)   Received from [facility], [facility]   Social Network    Social Network: Not on file    Her Allergies Are:  Allergies  Allergen Reactions   Isometheptene-Dichloral-Apap  Numbness: MIDRIN  :   Her Current Medications Are:  Outpatient Encounter Medications as of 10/28/2022  Medication Sig   benzonatate (TESSALON) 100 MG capsule Take 1 capsule (100 mg total) by mouth 2 (two) times daily as needed for cough.   cyclobenzaprine (FLEXERIL) 5 MG tablet Take 1 tablet (5 mg total) by mouth 3 (three) times daily as needed for muscle spasms.   doxycycline (VIBRAMYCIN) 100 MG capsule Take 100 mg by mouth daily.   escitalopram (LEXAPRO) 10 MG tablet Take 15 mg by mouth daily.   guaiFENesin (ROBITUSSIN) 100 MG/5ML liquid Take 5 mLs by mouth every 4 (four) hours as needed for cough or to loosen phlegm.   levocetirizine (XYZAL) 5 MG tablet Take 1 tablet (5 mg total) by mouth every evening.   Prenatal Vit-Fe Fumarate-FA (PRENATAL PO) Take by mouth.   [START ON 01/13/2023] Semaglutide-Weight Management 1.7 MG/0.75ML SOAJ Inject 1.7 mg into the skin once a week for 28 days.   No facility-administered encounter medications on file as of 10/28/2022.  :  Review of Systems:  Out of a complete 14 point review of systems, all are reviewed and negative with the exception of these symptoms as listed below:   Review of Systems  Neurological:        Patient is here alone for initial cpap f/u. She states everything is good with the machine. Denies any problems with the mask. ESS 7 FSS 43    Objective:  Neurological Exam  Physical Exam Physical Examination:   Vitals:   10/28/22 0923  BP: 125/80  Pulse: 78    General Examination: The patient is a very pleasant 32 y.o. female in no acute  distress. She appears well-developed and well-nourished and well groomed.   HEENT: Normocephalic, atraumatic, pupils are equal, round and reactive to light, extraocular tracking is good without limitation to gaze excursion or nystagmus noted. Hearing is grossly intact. Face is symmetric with normal facial animation. Speech is clear with no dysarthria noted. There is no hypophonia. There is no lip, neck/head, jaw or voice tremor. Neck is supple with full range of passive and active motion. There are no carotid bruits on auscultation. Oropharynx exam reveals: mild mouth dryness, good dental hygiene and moderate airway crowding.  Tongue protrudes centrally and palate elevates symmetrically.   Chest: Clear to auscultation without wheezing, rhonchi or crackles noted.   Heart: S1+S2+0, regular and normal without murmurs, rubs or gallops noted.    Abdomen: Soft, non-tender and non-distended.   Extremities: There is no obvious swelling in the distal lower extremities bilaterally.    Skin: Warm and dry without trophic changes noted.    Musculoskeletal: exam reveals no obvious joint deformities.    Neurologically:  Mental status: The patient is awake, alert and oriented in all 4 spheres. Her immediate and remote memory, attention, language skills and fund of knowledge are appropriate. There is no evidence of aphasia, agnosia, apraxia or anomia. Speech is clear with normal prosody and enunciation. Thought process is linear. Mood is normal and affect is normal.  Cranial nerves II - XII are as described above under HEENT exam.  Motor exam: Normal bulk, strength and tone is noted. There is no obvious action or resting tremor.  Fine motor skills and coordination: grossly intact.  Cerebellar testing: No dysmetria or intention tremor. There is no truncal or gait ataxia.  Sensory exam: intact to light touch in the upper and lower extremities.  Gait, station and balance: She stands easily. No veering  to one side  is noted. No leaning to one side is noted. Posture is age-appropriate and stance is narrow based. Gait shows normal stride length and normal pace. No problems turning are noted.    Assessment and Plan:  In summary, NOVEL SCHUCHMANN is a very pleasant 32 year old female with an underlying medical history of reflux disease, PCOS, urticaria, migraine headaches, irritable bowel syndrome, and obesity, who presents for follow-up consultation of obstructive sleep apnea after interim testing and starting home AutoPap therapy.    She had a home sleep test on 08/02/2022 which showed a total AHI of 12.4/hour and O2 nadir of 85%. She has been on home AutoPap therapy since 08/26/22. She has a ResMed air sense 10 AutoSet machine, her DME provider is [facility].she is compliant with treatment and has noticed some benefit including better sleep consolidation, less restlessness at night, improved snoring.  Partners feedback.  She is advised to continue with current treatment but I did suggest that we increase her maximum pressure to 13 cm at the time to see if we can reduce her AHI even further.  She is commended for treatment adherence.  She is motivated to continue with treatment and is also advised that with time she may reap more benefit from it.  She is working on weight loss.  She is advised to follow-up routinely in this clinic in 1 year to see one of our nurse practitioners, we can also offer her a MyChart video visit if she prefers.  We reviewed her sleep test results and her compliance data in detail today as well.  I answered all her questions today and she was in agreement. I spent 30 minutes in total face-to-face time and in reviewing records during pre-charting, more than 50% of which was spent in counseling and coordination of care, reviewing test results, reviewing medications and treatment regimen and/or in discussing or reviewing the diagnosis of OSA, the prognosis and treatment options. Pertinent laboratory and  imaging test results that were available during this visit with the patient were reviewed by me and considered in my medical decision making (see chart for details).

## 2022-10-28 NOTE — Patient Instructions (Addendum)
It was nice to see you again today. I am glad to hear, things are going well with your autoPAP therapy. You have adjusted well to treatment with your new machine, and you are compliant with it. You have also fulfilled the insurance-mandated compliance percentage, which is reassuring, so you can get ongoing supplies through your insurance. Please talk to your DME provider about getting replacement supplies on a regular basis. Please be sure to change your filter every month, your mask about every 3 months, hose about every 6 months, humidifier chamber about yearly. Some restrictions are imposed by your insurance carrier with regard to how frequently you can get certain supplies.  Your DME company can provide further details if necessary.  As discussed, we will increase your maximum AutoPap pressure to 13 cm from currently 12 cm for better apnea control.  Please continue to work on weight loss.   Please continue using your autoPAP regularly. While your insurance requires that you use PAP at least 4 hours each night on 70% of the nights, I recommend, that you not skip any nights and use it throughout the night if you can. Getting used to PAP and staying with the treatment long term does take time and patience and discipline. Untreated obstructive sleep apnea when it is moderate to severe can have an adverse impact on cardiovascular health and raise her risk for heart disease, arrhythmias, hypertension, congestive heart failure, stroke and diabetes. Untreated obstructive sleep apnea causes sleep disruption, nonrestorative sleep, and sleep deprivation. This can have an impact on your day to day functioning and cause daytime sleepiness and impairment of cognitive function, memory loss, mood disturbance, and problems focussing. Using PAP regularly can improve these symptoms.  We can see you in 1 year, you can see one of our nurse practitioners as you are stable.

## 2022-11-04 ENCOUNTER — Telehealth: Payer: Self-pay | Admitting: Family Medicine

## 2022-11-04 ENCOUNTER — Other Ambulatory Visit: Payer: Self-pay

## 2022-11-04 DIAGNOSIS — R053 Chronic cough: Secondary | ICD-10-CM

## 2022-11-04 MED ORDER — LEVOCETIRIZINE DIHYDROCHLORIDE 5 MG PO TABS: 5.0000 mg | ORAL_TABLET | Freq: Every evening | ORAL | 1 refills | Status: DC

## 2022-11-04 NOTE — Telephone Encounter (Signed)
Prescription Request  11/04/2022  LOV: 09/21/2022  What is the name of the medication or equipment? levocetirizine (XYZAL) 5 MG tablet   Have you contacted your pharmacy to request a refill? Yes   Which pharmacy would you like this sent to?   [facility] [id number] - [place], [place] - [address] AT [facility] [address] [place] [address] Phone: [phone] Fax: [phone]      Patient notified that their request is being sent to the clinical staff for review and that they should receive a response within 2 business days.   Please advise at [facility] [phone]

## 2022-11-04 NOTE — Telephone Encounter (Signed)
Refill sent.

## 2022-11-24 NOTE — Progress Notes (Unsigned)
   Patient Office Visit   Subjective   Patient ID: AMAIRAH KOELLNER, female    DOB: 1990/04/29  Age: 32 y.o. MRN: [id number]  CC: No chief complaint on file.   HPI KEWANDA SCHWEGEL 32 year old female, presents to the clinic for She  has a past medical history of Breathing difficulty, GERD (gastroesophageal reflux disease), Irritable bowel syndrome, Migraine, Morbid obesity (HCC), Polycystic ovarian syndrome, and Urticaria.  HPI    Outpatient Encounter Medications as of 11/25/2022  Medication Sig   benzonatate (TESSALON) 100 MG capsule Take 1 capsule (100 mg total) by mouth 2 (two) times daily as needed for cough.   cyclobenzaprine (FLEXERIL) 5 MG tablet Take 1 tablet (5 mg total) by mouth 3 (three) times daily as needed for muscle spasms.   doxycycline (VIBRAMYCIN) 100 MG capsule Take 100 mg by mouth daily.   escitalopram (LEXAPRO) 10 MG tablet Take 15 mg by mouth daily.   guaiFENesin (ROBITUSSIN) 100 MG/5ML liquid Take 5 mLs by mouth every 4 (four) hours as needed for cough or to loosen phlegm.   levocetirizine (XYZAL) 5 MG tablet Take 1 tablet (5 mg total) by mouth every evening.   Prenatal Vit-Fe Fumarate-FA (PRENATAL PO) Take by mouth.   [START ON 01/13/2023] Semaglutide-Weight Management 1.7 MG/0.75ML SOAJ Inject 1.7 mg into the skin once a week for 28 days.   No facility-administered encounter medications on file as of 11/25/2022.    Past Surgical History:  Procedure Laterality Date   CESAREAN SECTION  2023   CHOLECYSTECTOMY  2014   ESOPHAGOGASTRODUODENOSCOPY  05/26/2011   Procedure: ESOPHAGOGASTRODUODENOSCOPY (EGD);  Surgeon: Malissa Hippo, MD;  Location[facility];  Service: Endoscopy;  Laterality: N/A;  300   KNEE ARTHROSCOPY Right 2010   left shoulder surgery Left 2015   TONSILLECTOMY AND ADENOIDECTOMY     <45 years old    ROS    Objective    There were no vitals taken for this visit.  Physical Exam    Assessment & Plan:  There are no diagnoses linked to this  encounter.  No follow-ups on file.   Cruzita Lederer Newman Nip, FNP

## 2022-11-24 NOTE — Patient Instructions (Signed)

## 2022-11-25 ENCOUNTER — Other Ambulatory Visit: Payer: Self-pay | Admitting: Family Medicine

## 2022-11-25 ENCOUNTER — Ambulatory Visit: Payer: 59 | Admitting: Family Medicine

## 2022-11-25 ENCOUNTER — Telehealth: Payer: Self-pay | Admitting: Family Medicine

## 2022-11-25 MED ORDER — SEMAGLUTIDE-WEIGHT MANAGEMENT 2.4 MG/0.75ML ~~LOC~~ SOAJ: 2.4000 mg | SUBCUTANEOUS | 2 refills | Status: DC

## 2022-11-25 NOTE — Telephone Encounter (Signed)
Lvm

## 2022-11-25 NOTE — Telephone Encounter (Signed)
Patient had an appointment 09.05.2024 at 1:00 pm patient showed up 21 minutes late had to rechedule. She put it on her calendar at 1:30 pm. Can enough medicine be called into her pharmacy until 09.23.2024 next appointment.  Semaglutide-Weight Management 1.7 MG/0.75ML [facility]   Pharmacy  [facility] [place], [place] - [address] [place], [place] [address] Phone: [phone]  Fax: [phone]

## 2022-11-25 NOTE — Telephone Encounter (Signed)
sent 

## 2022-12-08 ENCOUNTER — Ambulatory Visit
Admission: EM | Admit: 2022-12-08 | Discharge: 2022-12-08 | Disposition: A | Discharge status: Home / Self Care | Payer: 59 | Attending: Nurse Practitioner | Admitting: Nurse Practitioner

## 2022-12-08 ENCOUNTER — Other Ambulatory Visit: Payer: Self-pay

## 2022-12-08 DIAGNOSIS — J069 Acute upper respiratory infection, unspecified: Secondary | ICD-10-CM | POA: Insufficient documentation

## 2022-12-08 DIAGNOSIS — Z1152 Encounter for screening for COVID-19: Secondary | ICD-10-CM | POA: Diagnosis present

## 2022-12-08 LAB — POCT INFLUENZA A/B
Influenza A, POC: NEGATIVE
Influenza B, POC: NEGATIVE

## 2022-12-08 LAB — POCT RAPID STREP A (OFFICE): Rapid Strep A Screen: NEGATIVE

## 2022-12-08 MED ORDER — PROMETHAZINE-DM 6.25-15 MG/5ML PO SYRP: 5.0000 mL | ORAL_SOLUTION | Freq: Four times a day (QID) | ORAL | 0 refills | Status: DC | PRN

## 2022-12-08 MED ORDER — FLUTICASONE PROPIONATE 50 MCG/ACT NA SUSP: 2.0000 | Freq: Every day | NASAL | 0 refills | Status: DC

## 2022-12-08 NOTE — Discharge Instructions (Addendum)
The rapid strep test was negative.  A throat culture and COVID test are pending.  You will be contacted if the pending test result is positive.  You will also have access to your results via MyChart. Take medication as prescribed. Continue over-the-counter Tylenol or ibuprofen as needed for pain, fever, general discomfort. Increase fluids and allow for plenty of rest. Warm salt water gargles 3-4 times daily as needed for throat pain or discomfort. Recommend normal saline nasal spray throughout the day to help with nasal congestion and runny nose. For the cough, recommend using a humidifier in your bedroom at nighttime during sleep and sleeping elevated on pillows while cough symptoms persist. If your COVID test is positive and you have a fever, you will need to remain home until you have been fever free for 24 hours with no medication.  You are able to resume your normal activities if you have symptoms as long as you are wearing a mask. Please be advised that this most likely is a viral illness and symptoms can last up to 14 days.  If symptoms extend beyond that time, or symptoms suddenly worsen, please follow-up in this clinic or with your primary care physician for further evaluation. Follow-up as needed.

## 2022-12-08 NOTE — ED Provider Notes (Signed)
[facility]    CSN: [id number] Arrival date & time: 12/08/22  1750      History   Chief Complaint No chief complaint on file.   HPI EMILIJA HUSTEAD is a 32 y.o. female.   The history is provided by the patient.   Patient presents for complaints of low-grade fever, body aches, fatigue, chills, headache, nasal congestion, left ear pain, sore throat, and cough.  Symptoms started over the past 24 hours.  Patient denies ear drainage, wheezing, difficulty breathing, chest pain, abdominal pain, nausea, vomiting, or diarrhea.  Patient reports she has been taking Tylenol for her symptoms.  She denies any obvious known sick contacts. Past Medical History:  Diagnosis Date   Breathing difficulty    bronchitis   GERD (gastroesophageal reflux disease)    pt states she no longer has this   Irritable bowel syndrome    Migraine    Morbid obesity (HCC)    Polycystic ovarian syndrome    Urticaria    pt states this was when she had a reaction to an all natural hair product    Patient Active Problem List   Diagnosis Date Noted   Class 3 severe obesity due to excess calories with serious comorbidity and body mass index (BMI) of 40.0 to 44.9 in adult [facility]) 07/01/2022   Back pain 07/01/2022   Thyroid nodule 06/03/2022   BMI 40.0-44.9, adult (HCC) 06/03/2022   Sore throat 04/25/2022   Left maxillary sinusitis 04/21/2022   Right serous otitis media 04/21/2022   Snoring 04/11/2022   Obesity due to excess calories 04/11/2022   Encounter for general adult medical examination with abnormal findings 03/19/2022   Gestational hypertension, third trimester 07/21/2021   Thyroid antibody positive 09/10/2016   Abnormal liver enzymes 09/10/2016   PCOS (polycystic ovarian syndrome) 09/10/2016   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   Chronic urticaria 06/09/2016   Migraine without aura and without status migrainosus, not intractable 12/05/2014   Labral tear of shoulder 08/23/2014   Migraine  05/30/2014   Diarrhea 02/05/2014   IBS (irritable bowel syndrome) 12/06/2013   H/O iron deficiency 12/06/2013   RUQ abdominal pain 03/20/2012   GERD (gastroesophageal reflux disease) 05/25/2011   Epigastric pain 05/25/2011   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      Home Medications    Prior to Admission medications   Medication Sig Start Date End Date Taking? Authorizing Provider  fluticasone (FLONASE) 50 MCG/ACT nasal spray Place 2 sprays into both nostrils daily. 12/08/22  Yes Karam Dunson-Warren, Sadie Haber, NP  promethazine-dextromethorphan (PROMETHAZINE-DM) 6.25-15 MG/5ML syrup Take 5 mLs by mouth 4 (four) times daily as needed. 12/08/22  Yes Farryn Linares-Warren, Sadie Haber, NP  benzonatate (TESSALON) 100 MG capsule Take 1 capsule (100 mg total) by mouth 2 (two) times daily as needed for cough. 08/17/22   Del Nigel Berthold, FNP  cyclobenzaprine (FLEXERIL) 5 MG tablet Take 1 tablet (5 mg total) by mouth 3 (three) times daily as needed for muscle spasms. 07/01/22   Lonna Cobb  Polanco, Tenna Child, FNP  doxycycline (VIBRAMYCIN) 100 MG capsule Take 100 mg by mouth daily. 04/12/22   [provider]  escitalopram (LEXAPRO) 10 MG tablet Take 15 mg by mouth daily.    [provider]  guaiFENesin (ROBITUSSIN) 100 MG/5ML liquid Take 5 mLs by mouth every 4 (four) hours as needed for cough or to loosen phlegm. 08/17/22   Del Nigel Berthold, FNP  levocetirizine (XYZAL) 5 MG tablet Take 1 tablet (5 mg total) by mouth every evening. 11/04/22   Del Nigel Berthold, FNP  Prenatal Vit-Fe Fumarate-FA (PRENATAL PO) Take by mouth.    [provider]  Semaglutide-Weight Management 2.4 MG/0.75ML SOAJ Inject 2.4 mg into the skin once a week. 11/25/22   Del Nigel Berthold, FNP    Family History Family History  Problem Relation Age of Onset   Thyroid disease Mother    GER disease Mother    Irritable bowel syndrome Mother    Addison's disease Mother    Allergic rhinitis Mother    Sleep apnea Mother    High blood pressure Mother    High blood pressure Father    GER disease Father    Thyroid disease Sister    Healthy Sister    Healthy Sister    Allergic rhinitis Sister    Sleep apnea Sister    Allergic rhinitis Maternal Grandmother    Angioedema Neg Hx    Asthma Neg Hx    Eczema Neg Hx    Immunodeficiency Neg Hx    Urticaria Neg Hx     Social History Social History   Tobacco Use   Smoking status: Never   Smokeless tobacco: Never  Vaping Use   Vaping status: Never Used  Substance Use Topics   Alcohol use: No   Drug use: No     Allergies   Isometheptene-dichloral-apap   Review of Systems Review of Systems Per HPI  Physical Exam Triage Vital Signs ED Triage Vitals [12/08/22 1826]  Encounter Vitals Group     BP 100/74     Systolic BP Percentile      Diastolic BP Percentile      Pulse Rate 94     Resp 14     Temp 100 F (37.8 C)     Temp Source Oral     SpO2 96 %     Weight      Height      Head Circumference      Peak Flow      Pain Score 6     Pain Loc      Pain Education      Exclude from Growth Chart    No data found.  Updated Vital Signs BP 100/74 (BP Location: Right Arm)   Pulse 94   Temp 100 F (37.8 C) (Oral)   Resp 14   SpO2 96%   Visual Acuity Right Eye Distance:   Left Eye Distance:   Bilateral Distance:    Right Eye Near:   Left Eye Near:    Bilateral Near:     Physical Exam Vitals and nursing note reviewed.  Constitutional:      General: She is not in acute  distress.    Appearance: Normal appearance.  HENT:     Head: Normocephalic.     Right Ear: Tympanic membrane, ear canal and external ear normal.     Left Ear: Tympanic membrane, ear canal and external ear normal.     Nose: Congestion  present.     Mouth/Throat:     Mouth: Mucous membranes are moist.     Pharynx: Posterior oropharyngeal erythema present.     Comments: Cobblestoning present to posterior oropharynx Eyes:     Extraocular Movements: Extraocular movements intact.     Conjunctiva/sclera: Conjunctivae normal.     Pupils: Pupils are equal, round, and reactive to light.  Cardiovascular:     Rate and Rhythm: Normal rate and regular rhythm.     Pulses: Normal pulses.     Heart sounds: Normal heart sounds.  Pulmonary:     Effort: Pulmonary effort is normal. No respiratory distress.     Breath sounds: Normal breath sounds. No stridor. No wheezing, rhonchi or rales.  Abdominal:     General: Bowel sounds are normal.     Palpations: Abdomen is soft.     Tenderness: There is no abdominal tenderness.  Musculoskeletal:     Cervical back: Normal range of motion.  Lymphadenopathy:     Cervical: No cervical adenopathy.  Skin:    General: Skin is warm and dry.  Neurological:     General: No focal deficit present.     Mental Status: She is alert and oriented to person, place, and time.  Psychiatric:        Mood and Affect: Mood normal.        Behavior: Behavior normal.      UC Treatments / Results  Labs (all labs ordered are listed, but only abnormal results are displayed) Labs Reviewed  SARS CORONAVIRUS 2 (TAT 6-24 HRS)  CULTURE, GROUP A STREP [facility])  POCT RAPID STREP A (OFFICE)  POCT INFLUENZA A/B    EKG   Radiology No results found.  Procedures Procedures (including critical care time)  Medications Ordered in UC Medications - No data to display  Initial Impression / Assessment and Plan / UC Course  I have reviewed the triage vital signs and the nursing  notes.  Pertinent labs & imaging results that were available during my care of the patient were reviewed by me and considered in my medical decision making (see chart for details).  The patient is well-appearing, he is in no acute distress, vital signs are stable.  Rapid strep test and influenza test were negative.  COVID test and throat culture are pending.  Patient is able to receive Paxlovid if her COVID test is positive.  Suspect symptoms are related to viral upper respiratory infection.  Will provide symptomatic treatment with Promethazine DM for the cough and nasal congestion, and fluticasone 50 mcg nasal spray for nasal congestion.  Supportive care recommendations were provided and discussed with the patient to include increasing fluids, allowing for plenty of rest, over-the-counter analgesics, normal saline nasal spray, and warm salt water gargles.  Discussed viral etiology with the patient and when follow-up may be indicated.  Also discussed with patient that if her test is positive for COVID, she should remain home until she has been fever free for 24 hours with no medication.  Patient is in agreement with this plan of care and verbalizes understanding.  All questions were answered.  Patient stable for discharge.  Work note was provided.  Final Clinical Impressions(s) / UC Diagnoses   Final diagnoses:  Viral upper respiratory tract infection with cough  Encounter for screening for COVID-19     Discharge Instructions      The rapid strep test was negative.  A throat culture and COVID test are pending.  You will be contacted  if the pending test result is positive.  You will also have access to your results via MyChart. Take medication as prescribed. Continue over-the-counter Tylenol or ibuprofen as needed for pain, fever, general discomfort. Increase fluids and allow for plenty of rest. Warm salt water gargles 3-4 times daily as needed for throat pain or discomfort. Recommend normal  saline nasal spray throughout the day to help with nasal congestion and runny nose. For the cough, recommend using a humidifier in your bedroom at nighttime during sleep and sleeping elevated on pillows while cough symptoms persist. If your COVID test is positive and you have a fever, you will need to remain home until you have been fever free for 24 hours with no medication.  You are able to resume your normal activities if you have symptoms as long as you are wearing a mask. Please be advised that this most likely is a viral illness and symptoms can last up to 14 days.  If symptoms extend beyond that time, or symptoms suddenly worsen, please follow-up in this clinic or with your primary care physician for further evaluation. Follow-up as needed.     ED Prescriptions     Medication Sig Dispense Auth. Provider   fluticasone (FLONASE) 50 MCG/ACT nasal spray Place 2 sprays into both nostrils daily. 16 g Trenia Tennyson-Warren, Sadie Haber, NP   promethazine-dextromethorphan (PROMETHAZINE-DM) 6.25-15 MG/5ML syrup Take 5 mLs by mouth 4 (four) times daily as needed. 118 mL Bryann Gentz-Warren, Sadie Haber, NP      PDMP not reviewed this encounter.   Abran Cantor, NP 12/08/22 1926

## 2022-12-08 NOTE — ED Triage Notes (Signed)
Pt c/o headache, nasal congestion, low grade fever,body ache,  left ear pain, cough, sore throat x 1 day

## 2022-12-09 ENCOUNTER — Telehealth: Payer: Self-pay

## 2022-12-09 LAB — SARS CORONAVIRUS 2 (TAT 6-24 HRS): SARS Coronavirus 2: POSITIVE — AB

## 2022-12-09 MED ORDER — PAXLOVID (300/100) 20 X 150 MG & 10 X 100MG PO TBPK: 3.0000 | ORAL_TABLET | Freq: Two times a day (BID) | ORAL | 0 refills | Status: DC

## 2022-12-09 NOTE — Telephone Encounter (Signed)
Pt called requesting Paxlovid , after receiving positive COVID  positive for COVID on yesterday. Per Provider okay to send in Paxlovid. Medication has been sent in.

## 2022-12-11 LAB — CULTURE, GROUP A STREP (THRC)

## 2022-12-13 ENCOUNTER — Other Ambulatory Visit (HOSPITAL_COMMUNITY): Payer: Self-pay

## 2022-12-13 ENCOUNTER — Ambulatory Visit: Payer: 59 | Admitting: Family Medicine

## 2022-12-13 DIAGNOSIS — Z1329 Encounter for screening for other suspected endocrine disorder: Secondary | ICD-10-CM

## 2022-12-13 DIAGNOSIS — Z6841 Body Mass Index (BMI) 40.0 and over, adult: Secondary | ICD-10-CM | POA: Diagnosis not present

## 2022-12-13 DIAGNOSIS — E7849 Other hyperlipidemia: Secondary | ICD-10-CM

## 2022-12-13 MED ORDER — SEMAGLUTIDE-WEIGHT MANAGEMENT 2.4 MG/0.75ML ~~LOC~~ SOAJ
2.4000 mg | SUBCUTANEOUS | 2 refills | Status: DC
  Filled 2022-12-13 – 2022-12-14 (×3): qty 3, 28d supply, fill #0
  Filled 2023-01-15: qty 3, 28d supply, fill #1
  Filled 2023-02-08: qty 3, 28d supply, fill #2

## 2022-12-13 NOTE — Patient Instructions (Signed)

## 2022-12-13 NOTE — Progress Notes (Signed)
Patient Office Visit   Subjective   Patient ID: Deborah Douglas, female    DOB: [birth year]  Age: 32 y.o. MRN: [id number]  CC:  Chief Complaint  Patient presents with   Weight Loss Management    HPI Deborah Douglas 32 year old female, presents to the clinic for weight loss management  She  has a past medical history of Breathing difficulty, GERD (gastroesophageal reflux disease), Irritable bowel syndrome, Migraine, Morbid obesity (HCC), Polycystic ovarian syndrome, and Urticaria.For the details of today's visit, please refer to assessment and plan.   HPI    Outpatient Encounter Medications as of 12/13/2022  Medication Sig   escitalopram (LEXAPRO) 10 MG tablet Take 15 mg by mouth daily.   fluticasone (FLONASE) 50 MCG/ACT nasal spray Place 2 sprays into both nostrils daily.   levocetirizine (XYZAL) 5 MG tablet Take 1 tablet (5 mg total) by mouth every evening.   Prenatal Vit-Fe Fumarate-FA (PRENATAL PO) Take by mouth.   [DISCONTINUED] Semaglutide-Weight Management 2.4 MG/0.75ML SOAJ Inject 2.4 mg into the skin once a week.   guaiFENesin (ROBITUSSIN) 100 MG/5ML liquid Take 5 mLs by mouth every 4 (four) hours as needed for cough or to loosen phlegm. (Patient not taking: Reported on 12/13/2022)   promethazine-dextromethorphan (PROMETHAZINE-DM) 6.25-15 MG/5ML syrup Take 5 mLs by mouth 4 (four) times daily as needed. (Patient not taking: Reported on 12/13/2022)   Semaglutide-Weight Management 2.4 MG/0.75ML SOAJ Inject 2.4 mg into the skin once a week.   [DISCONTINUED] benzonatate (TESSALON) 100 MG capsule Take 1 capsule (100 mg total) by mouth 2 (two) times daily as needed for cough.   [DISCONTINUED] cyclobenzaprine (FLEXERIL) 5 MG tablet Take 1 tablet (5 mg total) by mouth 3 (three) times daily as needed for muscle spasms.   [DISCONTINUED] doxycycline (VIBRAMYCIN) 100 MG capsule Take 100 mg by mouth daily.   [DISCONTINUED] nirmatrelvir & ritonavir (PAXLOVID, 300/100,) 20 x 150 MG & 10 x 100MG   TBPK Take 3 tablets by mouth 2 (two) times daily for 5 days.   No facility-administered encounter medications on file as of 12/13/2022.    Past Surgical History:  Procedure Laterality Date   CESAREAN SECTION  2023   CHOLECYSTECTOMY  2014   ESOPHAGOGASTRODUODENOSCOPY  05/26/2011   Procedure: ESOPHAGOGASTRODUODENOSCOPY (EGD);  Surgeon: Malissa Hippo, MD;  Location: [facility];  Service: Endoscopy;  Laterality: N/A;  300   KNEE ARTHROSCOPY Right 2010   left shoulder surgery Left 2015   TONSILLECTOMY AND ADENOIDECTOMY     <43 years old    Review of Systems  Constitutional:  Negative for chills and fever.  Respiratory:  Negative for shortness of breath.   Cardiovascular:  Negative for chest pain.  Gastrointestinal:  Negative for abdominal pain.  Genitourinary:  Negative for dysuria.  Musculoskeletal:  Negative for myalgias.  Neurological:  Negative for dizziness and headaches.      Objective    BP 112/77   Pulse 83   Resp 16   Ht 5\' 6"  (1.676 m)   Wt 250 lb (113.4 kg)   SpO2 95%   BMI 40.35 kg/m   Physical Exam Vitals reviewed.  Constitutional:      General: She is not in acute distress.    Appearance: Normal appearance. She is not ill-appearing, toxic-appearing or diaphoretic.  HENT:     Head: Normocephalic.  Eyes:     General:        Right eye: No discharge.        Left eye:  No discharge.     Conjunctiva/sclera: Conjunctivae normal.  Cardiovascular:     Rate and Rhythm: Normal rate.     Pulses: Normal pulses.     Heart sounds: Normal heart sounds.  Pulmonary:     Effort: Pulmonary effort is normal. No respiratory distress.     Breath sounds: Normal breath sounds.  Abdominal:     General: Bowel sounds are normal.     Palpations: Abdomen is soft.     Tenderness: There is no abdominal tenderness. There is no right CVA tenderness, left CVA tenderness or guarding.  Musculoskeletal:        General: Normal range of motion.     Cervical back: Normal range of  motion.  Skin:    General: Skin is warm and dry.     Capillary Refill: Capillary refill takes less than 2 seconds.  Neurological:     Mental Status: She is alert and oriented to person, place, and time.     Coordination: Coordination normal.     Gait: Gait normal.  Psychiatric:        Mood and Affect: Mood normal.        Behavior: Behavior normal.       Assessment & Plan:  Morbid obesity (HCC) -     Cortisol -     Semaglutide-Weight Management; Inject 2.4 mg into the skin once a week.  Dispense: 3 mL; Refill: 2 -     Amb Ref to Medical Weight Management  Other hyperlipidemia -     BMP8+eGFR -     Lipid panel -     CBC with Differential/Platelet  Screening for thyroid disorder -     TSH + free T4 -     Thyroid peroxidase antibody  BMI 40.0-44.9, adult [facility]) Assessment & Plan: Patient started [facility] on 08/17/22 with only 5lbs weight loss. Labs ordered in today's visit Increase dosage done today Wegovy 2.4 mg once weekly- maintenance therapy  Patient reports medication compliance every week with rotating injection sites. Patient states strength train 3x a week for 45 mins, with meal prepping includes high protein with low carb meals. Avoidance of sugary drinks and processed  foods.     Return in about 3 months (around 03/14/2023), or if symptoms worsen or fail to improve, for hyperlipidemia, Weight Loss Mangment.   Cruzita Lederer Newman Nip, FNP

## 2022-12-13 NOTE — Assessment & Plan Note (Addendum)
Patient started [facility] on 08/17/22 with only 5lbs weight loss. Labs ordered in today's visit Increase dosage done today Wegovy 2.4 mg once weekly- maintenance therapy  Patient reports medication compliance every week with rotating injection sites. Patient states strength train 3x a week for 45 mins, with meal prepping includes high protein with low carb meals. Avoidance of sugary drinks and processed  foods.

## 2022-12-14 ENCOUNTER — Other Ambulatory Visit (HOSPITAL_COMMUNITY): Payer: Self-pay

## 2022-12-14 ENCOUNTER — Telehealth: Payer: Self-pay | Admitting: Family Medicine

## 2022-12-14 NOTE — Telephone Encounter (Signed)
Prior auth approved

## 2022-12-14 NOTE — Telephone Encounter (Signed)
Pt informed

## 2022-12-14 NOTE — Telephone Encounter (Signed)
Pt needs pre auth on   Semaglutide-Weight Management 2.4 MG/0.75ML SOAJ

## 2022-12-15 ENCOUNTER — Other Ambulatory Visit: Payer: Self-pay | Admitting: Family Medicine

## 2022-12-15 ENCOUNTER — Other Ambulatory Visit (HOSPITAL_COMMUNITY): Payer: Self-pay

## 2022-12-15 DIAGNOSIS — E039 Hypothyroidism, unspecified: Secondary | ICD-10-CM

## 2022-12-15 LAB — THYROID PEROXIDASE ANTIBODY: Thyroperoxidase Ab SerPl-aCnc: 257 IU/mL — ABNORMAL HIGH (ref 0–34)

## 2022-12-15 LAB — CBC WITH DIFFERENTIAL/PLATELET
Basophils Absolute: 0.1 10*3/uL (ref 0.0–0.2)
Basos: 1 %
EOS (ABSOLUTE): 0.2 10*3/uL (ref 0.0–0.4)
Eos: 2 %
Hematocrit: 43.3 % (ref 34.0–46.6)
Hemoglobin: 13.2 g/dL (ref 11.1–15.9)
Immature Grans (Abs): 0.1 10*3/uL (ref 0.0–0.1)
Immature Granulocytes: 1 %
Lymphocytes Absolute: 3.6 10*3/uL — ABNORMAL HIGH (ref 0.7–3.1)
Lymphs: 35 %
MCH: 26.7 pg (ref 26.6–33.0)
MCHC: 30.5 g/dL — ABNORMAL LOW (ref 31.5–35.7)
MCV: 88 fL (ref 79–97)
Monocytes Absolute: 0.6 10*3/uL (ref 0.1–0.9)
Monocytes: 6 %
Neutrophils Absolute: 6 10*3/uL (ref 1.4–7.0)
Neutrophils: 55 %
Platelets: 317 10*3/uL (ref 150–450)
RBC: 4.94 x10E6/uL (ref 3.77–5.28)
RDW: 13.4 % (ref 11.7–15.4)
WBC: 10.6 10*3/uL (ref 3.4–10.8)

## 2022-12-15 LAB — BMP8+EGFR
BUN/Creatinine Ratio: 18 (ref 9–23)
BUN: 12 mg/dL (ref 6–20)
CO2: 23 mmol/L (ref 20–29)
Calcium: 8.8 mg/dL (ref 8.7–10.2)
Chloride: 103 mmol/L (ref 96–106)
Creatinine, Ser: 0.65 mg/dL (ref 0.57–1.00)
Glucose: 82 mg/dL (ref 70–99)
Potassium: 4.3 mmol/L (ref 3.5–5.2)
Sodium: 141 mmol/L (ref 134–144)
eGFR: 121 mL/min/{1.73_m2} (ref >59)

## 2022-12-15 LAB — CORTISOL: Cortisol: 9.7 ug/dL (ref 6.2–19.4)

## 2022-12-15 LAB — LIPID PANEL
Chol/HDL Ratio: 6.4 ratio — ABNORMAL HIGH (ref 0.0–4.4)
Cholesterol, Total: 179 mg/dL (ref 100–199)
HDL: 28 mg/dL — ABNORMAL LOW (ref >39)
LDL Chol Calc (NIH): 94 mg/dL (ref 0–99)
Triglycerides: 340 mg/dL — ABNORMAL HIGH (ref 0–149)
VLDL Cholesterol Cal: 57 mg/dL — ABNORMAL HIGH (ref 5–40)

## 2022-12-15 LAB — TSH+FREE T4
Free T4: 0.95 ng/dL (ref 0.82–1.77)
TSH: 5.58 u[IU]/mL — ABNORMAL HIGH (ref 0.450–4.500)

## 2022-12-15 MED ORDER — LEVOTHYROXINE SODIUM 25 MCG PO TABS: 25.0000 ug | ORAL_TABLET | Freq: Every day | ORAL | 3 refills | Status: DC

## 2022-12-15 MED ORDER — FISH OIL 1000 MG PO CAPS: 2.0000 | ORAL_CAPSULE | Freq: Every day | ORAL | 3 refills | Status: DC

## 2023-01-17 ENCOUNTER — Other Ambulatory Visit: Payer: Self-pay

## 2023-01-17 ENCOUNTER — Other Ambulatory Visit (HOSPITAL_COMMUNITY): Payer: Self-pay

## 2023-02-16 ENCOUNTER — Other Ambulatory Visit (HOSPITAL_BASED_OUTPATIENT_CLINIC_OR_DEPARTMENT_OTHER): Payer: Self-pay

## 2023-02-20 ENCOUNTER — Ambulatory Visit
Admission: RE | Admit: 2023-02-20 | Discharge: 2023-02-20 | Disposition: A | Discharge status: Home / Self Care | Payer: 59 | Source: Ambulatory Visit | Attending: Family Medicine | Admitting: Family Medicine

## 2023-02-20 VITALS — BP 111/80 | HR 89 | Temp 97.8°F | Resp 20

## 2023-02-20 DIAGNOSIS — J069 Acute upper respiratory infection, unspecified: Secondary | ICD-10-CM | POA: Diagnosis not present

## 2023-02-20 LAB — POC COVID19/FLU A&B COMBO
Covid Antigen, POC: NEGATIVE
Influenza A Antigen, POC: NEGATIVE
Influenza B Antigen, POC: NEGATIVE

## 2023-02-20 MED ORDER — PROMETHAZINE-DM 6.25-15 MG/5ML PO SYRP: 5.0000 mL | ORAL_SOLUTION | Freq: Four times a day (QID) | ORAL | 0 refills | Status: DC | PRN

## 2023-02-20 NOTE — ED Triage Notes (Signed)
Pt reports eye pressure, cough, sore throat, head fog, fever, chills, and body aches x 2 days    Took tylenol and advil but no relief

## 2023-02-20 NOTE — ED Provider Notes (Signed)
[facility]    CSN: [id number] Arrival date & time: 02/20/23  1149      History   Chief Complaint Chief Complaint  Patient presents with   Generalized Body Aches    Entered by patient    HPI Deborah Douglas is a 32 y.o. female.   Presenting today with 2-day history of cough, sore throat, fever, chills, body aches, eye pressure and sinus pressure.  Denies chest pain, shortness of breath, abdominal pain, nausea vomiting or diarrhea.  So far trying Tylenol ibuprofen with minimal relief.  No known sick contacts recently.    Past Medical History:  Diagnosis Date   Breathing difficulty    bronchitis   GERD (gastroesophageal reflux disease)    pt states she no longer has this   Irritable bowel syndrome    Migraine    Morbid obesity (HCC)    Polycystic ovarian syndrome    Urticaria    pt states this was when she had a reaction to an all natural hair product    Patient Active Problem List   Diagnosis Date Noted   Class 3 severe obesity due to excess calories with serious comorbidity and body mass index (BMI) of 40.0 to 44.9 in adult [facility]) 07/01/2022   Back pain 07/01/2022   Thyroid nodule 06/03/2022   BMI 40.0-44.9, adult (HCC) 06/03/2022   Sore throat 04/25/2022   Left maxillary sinusitis 04/21/2022   Right serous otitis media 04/21/2022   Snoring 04/11/2022   Obesity due to excess calories 04/11/2022   Encounter for general adult medical examination with abnormal findings 03/19/2022   Gestational hypertension, third trimester 07/21/2021   Thyroid antibody positive 09/10/2016   Abnormal liver enzymes 09/10/2016   PCOS (polycystic ovarian syndrome) 09/10/2016   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   Chronic urticaria 06/09/2016   Migraine without aura and without status migrainosus, not intractable 12/05/2014   Labral tear of shoulder 08/23/2014   Migraine 05/30/2014   Diarrhea 02/05/2014   IBS (irritable bowel syndrome) 12/06/2013   H/O iron deficiency  12/06/2013   RUQ abdominal pain 03/20/2012   GERD (gastroesophageal reflux disease) 05/25/2011   Epigastric pain 05/25/2011   FIBROMA 09/04/2008    Past Surgical History:  Procedure Laterality Date   CESAREAN SECTION  2023   CHOLECYSTECTOMY  2014   ESOPHAGOGASTRODUODENOSCOPY  05/26/2011   Procedure: ESOPHAGOGASTRODUODENOSCOPY (EGD);  Surgeon: Malissa Hippo, MD;  [facility];  Service: Endoscopy;  Laterality: N/A;  300   KNEE ARTHROSCOPY Right 2010   left shoulder surgery Left 2015   TONSILLECTOMY AND ADENOIDECTOMY     <36 years old    OB History   No obstetric history on file.      Home Medications    Prior to Admission medications   Medication Sig Start Date End Date Taking? Authorizing Provider  promethazine-dextromethorphan (PROMETHAZINE-DM) 6.25-15 MG/5ML syrup Take 5 mLs by mouth 4 (four) times daily as needed. 02/20/23  Yes Particia Nearing, PA-C  escitalopram (LEXAPRO) 10 MG tablet Take 15 mg by mouth daily.    [provider]  fluticasone (FLONASE) 50 MCG/ACT nasal spray Place 2 sprays into both nostrils daily. 12/08/22   Leath-Warren, Sadie Haber, NP  guaiFENesin (ROBITUSSIN) 100 MG/5ML liquid Take 5 mLs by mouth every 4 (four) hours as needed for cough or to loosen phlegm. Patient not taking: Reported on 12/13/2022 08/17/22   Del Nigel Berthold, FNP  levocetirizine (XYZAL) 5 MG tablet Take 1 tablet (5 mg total) by mouth  every evening. 11/04/22   Del Nigel Berthold, FNP  levothyroxine (SYNTHROID) 25 MCG tablet Take 1 tablet (25 mcg total) by mouth daily. 12/15/22   Del Nigel Berthold, FNP  Omega-3 Fatty Acids (FISH OIL) 1000 MG CAPS Take 2 capsules (2,000 mg total) by mouth daily. 12/15/22   Del Nigel Berthold, FNP  Prenatal Vit-Fe Fumarate-FA (PRENATAL PO) Take by mouth.    [provider]  promethazine-dextromethorphan (PROMETHAZINE-DM) 6.25-15 MG/5ML syrup Take 5 mLs by mouth 4 (four) times daily as  needed. Patient not taking: Reported on 12/13/2022 12/08/22   Leath-Warren, Sadie Haber, NP  Semaglutide-Weight Management 2.4 MG/0.75ML SOAJ Inject 2.4 mg into the skin once a week. 12/13/22   Del Nigel Berthold, FNP    Family History Family History  Problem Relation Age of Onset   Thyroid disease Mother    GER disease Mother    Irritable bowel syndrome Mother    Addison's disease Mother    Allergic rhinitis Mother    Sleep apnea Mother    High blood pressure Mother    High blood pressure Father    GER disease Father    Thyroid disease Sister    Healthy Sister    Healthy Sister    Allergic rhinitis Sister    Sleep apnea Sister    Allergic rhinitis Maternal Grandmother    Angioedema Neg Hx    Asthma Neg Hx    Eczema Neg Hx    Immunodeficiency Neg Hx    Urticaria Neg Hx     Social History Social History   Tobacco Use   Smoking status: Never   Smokeless tobacco: Never  Vaping Use   Vaping status: Never Used  Substance Use Topics   Alcohol use: No   Drug use: No     Allergies   Isometheptene-dichloral-apap   Review of Systems Review of Systems PER HPI  Physical Exam Triage Vital Signs ED Triage Vitals  Encounter Vitals Group     BP 02/20/23 1207 111/80     Systolic BP Percentile --      Diastolic BP Percentile --      Pulse Rate 02/20/23 1207 89     Resp 02/20/23 1207 20     Temp 02/20/23 1207 97.8 F (36.6 C)     Temp Source 02/20/23 1207 Oral     SpO2 02/20/23 1207 97 %     Weight --      Height --      Head Circumference --      Peak Flow --      Pain Score 02/20/23 1206 5     Pain Loc --      Pain Education --      Exclude from Growth Chart --    No data found.  Updated Vital Signs BP 111/80 (BP Location: Right Arm)   Pulse 89   Temp 97.8 F (36.6 C) (Oral)   Resp 20   SpO2 97%   Visual Acuity Right Eye Distance:   Left Eye Distance:   Bilateral Distance:    Right Eye Near:   Left Eye Near:    Bilateral Near:     Physical  Exam Vitals and nursing note reviewed.  Constitutional:      Appearance: Normal appearance.  HENT:     Head: Atraumatic.     Right Ear: Tympanic membrane and external ear normal.     Left Ear: Tympanic membrane and external ear normal.  Nose: Rhinorrhea present.     Mouth/Throat:     Mouth: Mucous membranes are moist.     Pharynx: Posterior oropharyngeal erythema present.  Eyes:     Extraocular Movements: Extraocular movements intact.     Conjunctiva/sclera: Conjunctivae normal.  Cardiovascular:     Rate and Rhythm: Normal rate and regular rhythm.     Heart sounds: Normal heart sounds.  Pulmonary:     Effort: Pulmonary effort is normal.     Breath sounds: Normal breath sounds. No wheezing or rales.  Musculoskeletal:        General: Normal range of motion.     Cervical back: Normal range of motion and neck supple.  Skin:    General: Skin is warm and dry.  Neurological:     Mental Status: She is alert and oriented to person, place, and time.  Psychiatric:        Mood and Affect: Mood normal.        Thought Content: Thought content normal.      UC Treatments / Results  Labs (all labs ordered are listed, but only abnormal results are displayed) Labs Reviewed  POC COVID19/FLU A&B COMBO    EKG   Radiology No results found.  Procedures Procedures (including critical care time)  Medications Ordered in UC Medications - No data to display  Initial Impression / Assessment and Plan / UC Course  I have reviewed the triage vital signs and the nursing notes.  Pertinent labs & imaging results that were available during my care of the patient were reviewed by me and considered in my medical decision making (see chart for details).     Vitals and exam overall reassuring and suggestive of a viral respiratory infection.  Rapid COVID and flu negative, treat with Phenergan DM, supportive care medications and home care.  Turn for worsening symptoms.  Final Clinical  Impressions(s) / UC Diagnoses   Final diagnoses:  Viral URI with cough   Discharge Instructions   None    ED Prescriptions     Medication Sig Dispense Auth. Provider   promethazine-dextromethorphan (PROMETHAZINE-DM) 6.25-15 MG/5ML syrup Take 5 mLs by mouth 4 (four) times daily as needed. 100 mL Particia Nearing, [place]      PDMP not reviewed this encounter.   Particia Nearing, [place] 02/20/23 1238

## 2023-03-08 ENCOUNTER — Other Ambulatory Visit: Payer: Self-pay | Admitting: Medical Genetics

## 2023-03-10 NOTE — Progress Notes (Signed)
Established Patient Office Visit   Subjective  Patient ID: Deborah Douglas, female    DOB: 08/02/1990  Age: 32 y.o. MRN: [id number]  Chief Complaint  Patient presents with   Follow-up    3 mo f/u hyperlipidemia, Weight Loss Mangment. No significant wt. Loss since last visit.     She  has a past medical history of Breathing difficulty, GERD (gastroesophageal reflux disease), Irritable bowel syndrome, Migraine, Morbid obesity (HCC), Polycystic ovarian syndrome, and Urticaria.  HPI Patient presents to the clinic for weight loss management. For the details of today's visit, please refer to assessment and plan.   Review of Systems  Constitutional:  Negative for chills and fever.  Respiratory:  Negative for shortness of breath.   Cardiovascular:  Negative for chest pain.  Neurological:  Negative for dizziness.      Objective:     BP 126/86   Pulse 97   Ht 5\' 6"  (1.676 m)   Wt 249 lb 1.3 oz (113 kg)   SpO2 93%   BMI 40.20 kg/m  BP Readings from Last 3 Encounters:  03/11/23 126/86  02/20/23 111/80  12/13/22 112/77      Physical Exam Vitals reviewed.  Constitutional:      General: She is not in acute distress.    Appearance: Normal appearance. She is not ill-appearing, toxic-appearing or diaphoretic.  HENT:     Head: Normocephalic.  Eyes:     General:        Right eye: No discharge.        Left eye: No discharge.     Conjunctiva/sclera: Conjunctivae normal.  Cardiovascular:     Rate and Rhythm: Normal rate.     Pulses: Normal pulses.     Heart sounds: Normal heart sounds.  Pulmonary:     Effort: Pulmonary effort is normal. No respiratory distress.     Breath sounds: Normal breath sounds.  Skin:    General: Skin is warm and dry.     Capillary Refill: Capillary refill takes less than 2 seconds.  Neurological:     General: No focal deficit present.     Mental Status: She is alert and oriented to person, place, and time.     Coordination: Coordination normal.      Gait: Gait normal.  Psychiatric:        Mood and Affect: Mood normal.        Behavior: Behavior normal.      No results found for any visits on 03/11/23.  The ASCVD Risk score (Arnett DK, et al., 2019) failed to calculate for the following reasons:   The 2019 ASCVD risk score is only valid for ages 44 to 58    Assessment & Plan:  Acquired hypothyroidism Assessment & Plan: Thyroid Panel ordered Synthroid 25 mcg once daily  Orders: -     TSH + free T4  Hyperlipidemia, unspecified hyperlipidemia type -     Lipid panel  IFG (impaired fasting glucose) -     Hemoglobin A1c  Encounter for immunization -     Flu vaccine trivalent PF, 6mos and older(Flulaval,Afluria,Fluarix,Fluzone)  BMI 40.0-44.9, adult [facility] [place]) Assessment & Plan: Patient experienced minimal progress during a 4-month trial of Wegovy, achieving only a 1-pound weight loss over the entire period.  Start a trial of 2.5 mg weekly injections of Zepound Discussed focus on creating a calorie deficit by eating a balanced diet rich in lean proteins, vegetables, and whole grains while reducing sugary and high-calorie foods. Incorporate  at least 30 minutes of moderate exercise, such as brisk walking or cycling, most days of the week. Stay hydrated and track your food intake to stay mindful of portions. Consistency and small, sustainable changes are key to achieving and maintaining your weight loss goal   Other orders -     Zepbound; Inject 2.5 mg into the skin once a week.  Dispense: 2 mL; Refill: 0    Return in about 4 months (around 07/10/2023), or if symptoms worsen or fail to improve, for Weight Loss Mangment, thyroid.   Cruzita Lederer Newman Nip, FNP

## 2023-03-10 NOTE — Patient Instructions (Signed)

## 2023-03-11 ENCOUNTER — Encounter: Payer: Self-pay | Admitting: Family Medicine

## 2023-03-11 ENCOUNTER — Other Ambulatory Visit (HOSPITAL_COMMUNITY): Payer: Self-pay

## 2023-03-11 ENCOUNTER — Ambulatory Visit: Payer: 59 | Admitting: Family Medicine

## 2023-03-11 VITALS — BP 126/86 | HR 97 | Ht 66.0 in | Wt 249.1 lb

## 2023-03-11 DIAGNOSIS — E039 Hypothyroidism, unspecified: Secondary | ICD-10-CM | POA: Insufficient documentation

## 2023-03-11 DIAGNOSIS — E785 Hyperlipidemia, unspecified: Secondary | ICD-10-CM

## 2023-03-11 DIAGNOSIS — R7301 Impaired fasting glucose: Secondary | ICD-10-CM

## 2023-03-11 DIAGNOSIS — Z23 Encounter for immunization: Secondary | ICD-10-CM

## 2023-03-11 DIAGNOSIS — Z6841 Body Mass Index (BMI) 40.0 and over, adult: Secondary | ICD-10-CM

## 2023-03-11 MED ORDER — ZEPBOUND 2.5 MG/0.5ML ~~LOC~~ SOAJ
2.5000 mg | SUBCUTANEOUS | 0 refills | Status: DC
  Filled 2023-03-11 – 2023-03-14 (×3): qty 2, 28d supply, fill #0

## 2023-03-11 NOTE — Assessment & Plan Note (Signed)
Thyroid Panel ordered Synthroid 25 mcg once daily

## 2023-03-11 NOTE — Assessment & Plan Note (Signed)
Patient experienced minimal progress during a 35-month trial of Wegovy, achieving only a 1-pound weight loss over the entire period.  Start a trial of 2.5 mg weekly injections of Zepound Discussed focus on creating a calorie deficit by eating a balanced diet rich in lean proteins, vegetables, and whole grains while reducing sugary and high-calorie foods. Incorporate at least 30 minutes of moderate exercise, such as brisk walking or cycling, most days of the week. Stay hydrated and track your food intake to stay mindful of portions. Consistency and small, sustainable changes are key to achieving and maintaining your weight loss goal

## 2023-03-12 LAB — LIPID PANEL
Chol/HDL Ratio: 5.2 {ratio} — ABNORMAL HIGH (ref 0.0–4.4)
Cholesterol, Total: 194 mg/dL (ref 100–199)
HDL: 37 mg/dL — ABNORMAL LOW (ref >39)
LDL Chol Calc (NIH): 129 mg/dL — ABNORMAL HIGH (ref 0–99)
Triglycerides: 156 mg/dL — ABNORMAL HIGH (ref 0–149)
VLDL Cholesterol Cal: 28 mg/dL (ref 5–40)

## 2023-03-12 LAB — HEMOGLOBIN A1C
Est. average glucose Bld gHb Est-mCnc: 108 mg/dL
Hgb A1c MFr Bld: 5.4 % (ref 4.8–5.6)

## 2023-03-12 LAB — TSH+FREE T4
Free T4: 1.16 ng/dL (ref 0.82–1.77)
TSH: 1.69 u[IU]/mL (ref 0.450–4.500)

## 2023-03-14 ENCOUNTER — Other Ambulatory Visit (HOSPITAL_COMMUNITY): Payer: Self-pay

## 2023-03-21 ENCOUNTER — Other Ambulatory Visit (HOSPITAL_COMMUNITY)
Admission: RE | Admit: 2023-03-21 | Discharge: 2023-03-21 | Disposition: A | Discharge status: Home / Self Care | Payer: Self-pay | Source: Ambulatory Visit | Attending: Oncology | Admitting: Oncology

## 2023-04-02 LAB — GENECONNECT MOLECULAR SCREEN: Genetic Analysis Overall Interpretation: NEGATIVE

## 2023-04-08 ENCOUNTER — Other Ambulatory Visit (HOSPITAL_COMMUNITY): Payer: Self-pay

## 2023-04-08 ENCOUNTER — Other Ambulatory Visit: Payer: Self-pay | Admitting: Family Medicine

## 2023-04-08 ENCOUNTER — Encounter: Payer: Self-pay | Admitting: Family Medicine

## 2023-04-08 MED ORDER — ZEPBOUND 5 MG/0.5ML ~~LOC~~ SOAJ
5.0000 mg | SUBCUTANEOUS | 0 refills | Status: DC
  Filled 2023-04-08 – 2023-04-12 (×3): qty 2, 28d supply, fill #0

## 2023-04-08 NOTE — Telephone Encounter (Signed)
sent 

## 2023-04-12 ENCOUNTER — Other Ambulatory Visit (HOSPITAL_COMMUNITY): Payer: Self-pay

## 2023-04-13 ENCOUNTER — Other Ambulatory Visit (HOSPITAL_COMMUNITY): Payer: Self-pay

## 2023-05-09 ENCOUNTER — Encounter: Payer: Self-pay | Admitting: Family Medicine

## 2023-05-10 ENCOUNTER — Other Ambulatory Visit: Payer: Self-pay | Admitting: Family Medicine

## 2023-05-10 ENCOUNTER — Other Ambulatory Visit (HOSPITAL_COMMUNITY): Payer: Self-pay

## 2023-05-10 MED ORDER — ZEPBOUND 7.5 MG/0.5ML ~~LOC~~ SOAJ
7.5000 mg | SUBCUTANEOUS | 0 refills | Status: DC
  Filled 2023-05-10: qty 2, 28d supply, fill #0

## 2023-05-10 NOTE — Telephone Encounter (Signed)
 sent

## 2023-05-11 ENCOUNTER — Other Ambulatory Visit (HOSPITAL_COMMUNITY): Payer: Self-pay

## 2023-05-16 ENCOUNTER — Other Ambulatory Visit (HOSPITAL_COMMUNITY): Payer: Self-pay

## 2023-05-18 NOTE — Progress Notes (Unsigned)
   Established Patient Office Visit   Subjective  Patient ID: Deborah Douglas, female    DOB: 1990-04-14  Age: 33 y.o. MRN: [id number]  No chief complaint on file.   She  has a past medical history of Breathing difficulty, GERD (gastroesophageal reflux disease), Irritable bowel syndrome, Migraine, Morbid obesity (HCC), Polycystic ovarian syndrome, and Urticaria.  Knee Pain     ROS    Objective:     There were no vitals taken for this visit. {Vitals History (Optional):23777}  Physical Exam   No results found for any visits on 05/19/23.  The ASCVD Risk score (Arnett DK, et al., 2019) failed to calculate for the following reasons:   The 2019 ASCVD risk score is only valid for ages 12 to 80    Assessment & Plan:  There are no diagnoses linked to this encounter.  No follow-ups on file.   Cruzita Lederer Newman Nip, FNP

## 2023-05-18 NOTE — Patient Instructions (Signed)

## 2023-05-19 ENCOUNTER — Ambulatory Visit (HOSPITAL_COMMUNITY)
Admission: RE | Admit: 2023-05-19 | Discharge: 2023-05-19 | Disposition: A | Discharge status: Home / Self Care | Payer: 59 | Source: Ambulatory Visit | Attending: Family Medicine | Admitting: Family Medicine

## 2023-05-19 ENCOUNTER — Encounter: Payer: Self-pay | Admitting: Family Medicine

## 2023-05-19 ENCOUNTER — Ambulatory Visit: Payer: 59 | Admitting: Family Medicine

## 2023-05-19 VITALS — BP 117/74 | HR 88 | Ht 66.0 in | Wt 264.0 lb

## 2023-05-19 DIAGNOSIS — M25562 Pain in left knee: Secondary | ICD-10-CM

## 2023-05-19 MED ORDER — CYCLOBENZAPRINE HCL 5 MG PO TABS: 5.0000 mg | ORAL_TABLET | Freq: Three times a day (TID) | ORAL | 2 refills | Status: DC | PRN

## 2023-05-19 MED ORDER — PREDNISONE 20 MG PO TABS: 20.0000 mg | ORAL_TABLET | Freq: Two times a day (BID) | ORAL | 0 refills | Status: AC

## 2023-05-19 NOTE — Assessment & Plan Note (Signed)
 Xray ordered awaiting results will follow up. Prednisone 20 mg twice daily x 5 days Advise patient treatment plan for knee pain includes rest, applying ice for 15-20 minutes several times a day, and gentle stretching and strengthening exercises to improve muscle support. Possible Physical therapy referral may help with proper technique. Using a knee brace or compression bandage can provide support, and elevating the leg helps reduce swelling. Low-impact exercises like swimming or cycling should be introduced gradually, and maintaining a healthy weight can prevent future pain.

## 2023-05-20 DIAGNOSIS — M25532 Pain in left wrist: Secondary | ICD-10-CM | POA: Insufficient documentation

## 2023-05-20 DIAGNOSIS — M25531 Pain in right wrist: Secondary | ICD-10-CM | POA: Insufficient documentation

## 2023-05-20 DIAGNOSIS — S63502A Unspecified sprain of left wrist, initial encounter: Secondary | ICD-10-CM | POA: Insufficient documentation

## 2023-05-24 ENCOUNTER — Encounter: Payer: Self-pay | Admitting: Family Medicine

## 2023-06-05 ENCOUNTER — Encounter: Payer: Self-pay | Admitting: Family Medicine

## 2023-06-08 ENCOUNTER — Encounter: Payer: Self-pay | Admitting: Family Medicine

## 2023-06-08 ENCOUNTER — Other Ambulatory Visit: Payer: Self-pay | Admitting: Family Medicine

## 2023-06-08 MED ORDER — TIRZEPATIDE-WEIGHT MANAGEMENT 10 MG/0.5ML ~~LOC~~ SOAJ
10.0000 mg | SUBCUTANEOUS | 0 refills | Status: DC
Start: 2023-06-08 — End: 2023-07-05
  Filled 2023-06-08: qty 2, 28d supply, fill #0

## 2023-06-08 NOTE — Telephone Encounter (Signed)
 sent

## 2023-06-09 ENCOUNTER — Other Ambulatory Visit (HOSPITAL_COMMUNITY): Payer: Self-pay

## 2023-07-05 ENCOUNTER — Encounter: Payer: Self-pay | Admitting: Family Medicine

## 2023-07-05 ENCOUNTER — Other Ambulatory Visit: Payer: Self-pay | Admitting: Family Medicine

## 2023-07-05 MED ORDER — ZEPBOUND 12.5 MG/0.5ML ~~LOC~~ SOAJ: 12.5000 mg | SUBCUTANEOUS | 0 refills | Status: DC

## 2023-07-06 ENCOUNTER — Other Ambulatory Visit (HOSPITAL_COMMUNITY): Payer: Self-pay

## 2023-07-06 ENCOUNTER — Other Ambulatory Visit: Payer: Self-pay | Admitting: Family Medicine

## 2023-07-06 MED ORDER — ZEPBOUND 12.5 MG/0.5ML ~~LOC~~ SOAJ
12.5000 mg | SUBCUTANEOUS | 0 refills | Status: DC
  Filled 2023-07-06: qty 2, 28d supply, fill #0

## 2023-07-06 NOTE — Telephone Encounter (Signed)
 sent

## 2023-07-13 ENCOUNTER — Encounter: Payer: Self-pay | Admitting: Family Medicine

## 2023-07-13 ENCOUNTER — Ambulatory Visit: Payer: 59 | Admitting: Family Medicine

## 2023-07-13 VITALS — BP 115/80 | HR 83 | Ht 66.0 in | Wt 243.2 lb

## 2023-07-13 DIAGNOSIS — E039 Hypothyroidism, unspecified: Secondary | ICD-10-CM | POA: Diagnosis not present

## 2023-07-13 DIAGNOSIS — E782 Mixed hyperlipidemia: Secondary | ICD-10-CM | POA: Diagnosis not present

## 2023-07-13 DIAGNOSIS — Z6839 Body mass index (BMI) 39.0-39.9, adult: Secondary | ICD-10-CM | POA: Diagnosis not present

## 2023-07-13 NOTE — Progress Notes (Signed)
 Established Patient Office Visit   Subjective  Patient ID: Deborah Douglas, female    DOB: 1991/03/13  Age: 33 y.o. MRN: [id number]  Chief Complaint  Patient presents with   Medical Management of Chronic Issues    Wt loss management and thyroid  issues     She  has a past medical history of Breathing difficulty, GERD (gastroesophageal reflux disease), Irritable bowel syndrome, Migraine, Morbid obesity (HCC), Polycystic ovarian syndrome, and Urticaria.  HPI Patient presents to the clinic for chronic follow up. For the details of today's visit, please refer to assessment and plan.   Review of Systems  Constitutional:  Negative for chills and fever.  Eyes:  Negative for blurred vision.  Respiratory:  Negative for shortness of breath.   Cardiovascular:  Negative for chest pain.  Gastrointestinal:  Negative for abdominal pain.  Neurological:  Negative for dizziness and headaches.      Objective:     BP 115/80   Pulse 83   Ht 5\' 6"  (1.676 m)   Wt 243 lb 3.2 oz (110.3 kg)   SpO2 97%   BMI 39.25 kg/m  BP Readings from Last 3 Encounters:  07/13/23 115/80  05/19/23 117/74  03/11/23 126/86      Physical Exam Vitals reviewed.  Constitutional:      General: She is not in acute distress.    Appearance: Normal appearance. She is not ill-appearing, toxic-appearing or diaphoretic.  HENT:     Head: Normocephalic.  Eyes:     General:        Right eye: No discharge.        Left eye: No discharge.     Conjunctiva/sclera: Conjunctivae normal.  Cardiovascular:     Rate and Rhythm: Normal rate.     Pulses: Normal pulses.     Heart sounds: Normal heart sounds.  Pulmonary:     Effort: Pulmonary effort is normal. No respiratory distress.     Breath sounds: Normal breath sounds.  Abdominal:     General: Bowel sounds are normal.     Palpations: Abdomen is soft.     Tenderness: There is no abdominal tenderness. There is no right CVA tenderness, left CVA tenderness or guarding.   Skin:    General: Skin is warm and dry.     Capillary Refill: Capillary refill takes less than 2 seconds.  Neurological:     Mental Status: She is alert.     Coordination: Coordination normal.     Gait: Gait normal.  Psychiatric:        Mood and Affect: Mood normal.        Behavior: Behavior normal.      No results found for any visits on 07/13/23.  The ASCVD Risk score (Arnett DK, et al., 2019) failed to calculate for the following reasons:   The 2019 ASCVD risk score is only valid for ages 47 to 66    Assessment & Plan:  Acquired hypothyroidism Assessment & Plan: Controlled Thyroid  Panel ordered Synthroid  25 mcg once daily  Orders: -     TSH + free T4  Mixed hyperlipidemia -     BMP8+eGFR -     CBC with Differential/Platelet -     Lipid panel  Class 2 severe obesity due to excess calories with serious comorbidity and body mass index (BMI) of 39.0 to 39.9 in adult [facility]) Assessment & Plan: Patient loss 21 lbs since last visit Continue with Zepound weekly injections Discussed Eat a Balanced Diet: Focus  on whole, nutrient-dense foods like lean proteins, vegetables, fruits, whole grains, and healthy fats while avoiding processed and sugary foods. Stay Active: Incorporate at least 30 minutes of moderate physical activity most days of the week, such as walking, jogging, or strength training. Hydrate and Rest: Drink plenty of water  throughout the day and ensure you get 7-9 hours of quality sleep each night to support metabolism and recovery. Practice Portion Control: Use smaller plates, measure portions, and eat mindfully to avoid overeating and manage calorie intake effectively.      Return in about 6 months (around 01/12/2024), or if symptoms worsen or fail to improve, for thyroid , Weight Loss Mangment.   Avelino Lek Amber Bail, FNP

## 2023-07-13 NOTE — Patient Instructions (Signed)

## 2023-07-13 NOTE — Assessment & Plan Note (Signed)
 Controlled Thyroid  Panel ordered Synthroid  25 mcg once daily

## 2023-07-13 NOTE — Assessment & Plan Note (Signed)
 Patient loss 21 lbs since last visit Continue with Zepound weekly injections Discussed Eat a Balanced Diet: Focus on whole, nutrient-dense foods like lean proteins, vegetables, fruits, whole grains, and healthy fats while avoiding processed and sugary foods. Stay Active: Incorporate at least 30 minutes of moderate physical activity most days of the week, such as walking, jogging, or strength training. Hydrate and Rest: Drink plenty of water  throughout the day and ensure you get 7-9 hours of quality sleep each night to support metabolism and recovery. Practice Portion Control: Use smaller plates, measure portions, and eat mindfully to avoid overeating and manage calorie intake effectively.

## 2023-07-14 ENCOUNTER — Encounter: Payer: Self-pay | Admitting: Family Medicine

## 2023-07-14 LAB — CBC WITH DIFFERENTIAL/PLATELET
Basophils Absolute: 0 10*3/uL (ref 0.0–0.2)
Basos: 1 %
EOS (ABSOLUTE): 0.1 10*3/uL (ref 0.0–0.4)
Eos: 1 %
Hematocrit: 42.6 % (ref 34.0–46.6)
Hemoglobin: 13.6 g/dL (ref 11.1–15.9)
Immature Grans (Abs): 0 10*3/uL (ref 0.0–0.1)
Immature Granulocytes: 0 %
Lymphocytes Absolute: 2.3 10*3/uL (ref 0.7–3.1)
Lymphs: 29 %
MCH: 28.2 pg (ref 26.6–33.0)
MCHC: 31.9 g/dL (ref 31.5–35.7)
MCV: 88 fL (ref 79–97)
Monocytes Absolute: 0.4 10*3/uL (ref 0.1–0.9)
Monocytes: 5 %
Neutrophils Absolute: 5.1 10*3/uL (ref 1.4–7.0)
Neutrophils: 64 %
Platelets: 342 10*3/uL (ref 150–450)
RBC: 4.82 x10E6/uL (ref 3.77–5.28)
RDW: 13.2 % (ref 11.7–15.4)
WBC: 8 10*3/uL (ref 3.4–10.8)

## 2023-07-14 LAB — BMP8+EGFR
BUN/Creatinine Ratio: 19 (ref 9–23)
BUN: 15 mg/dL (ref 6–20)
CO2: 21 mmol/L (ref 20–29)
Calcium: 9.7 mg/dL (ref 8.7–10.2)
Chloride: 103 mmol/L (ref 96–106)
Creatinine, Ser: 0.78 mg/dL (ref 0.57–1.00)
Glucose: 77 mg/dL (ref 70–99)
Potassium: 4.6 mmol/L (ref 3.5–5.2)
Sodium: 139 mmol/L (ref 134–144)
eGFR: 103 mL/min/{1.73_m2} (ref >59)

## 2023-07-14 LAB — TSH+FREE T4
Free T4: 1.14 ng/dL (ref 0.82–1.77)
TSH: 1.94 u[IU]/mL (ref 0.450–4.500)

## 2023-07-14 LAB — LIPID PANEL
Chol/HDL Ratio: 4.9 ratio — ABNORMAL HIGH (ref 0.0–4.4)
Cholesterol, Total: 165 mg/dL (ref 100–199)
HDL: 34 mg/dL — ABNORMAL LOW (ref >39)
LDL Chol Calc (NIH): 105 mg/dL — ABNORMAL HIGH (ref 0–99)
Triglycerides: 148 mg/dL (ref 0–149)
VLDL Cholesterol Cal: 26 mg/dL (ref 5–40)

## 2023-08-03 ENCOUNTER — Encounter: Payer: Self-pay | Admitting: Family Medicine

## 2023-08-04 ENCOUNTER — Other Ambulatory Visit (HOSPITAL_COMMUNITY): Payer: Self-pay

## 2023-08-04 ENCOUNTER — Other Ambulatory Visit: Payer: Self-pay | Admitting: Family Medicine

## 2023-08-04 MED ORDER — ZEPBOUND 15 MG/0.5ML ~~LOC~~ SOAJ
15.0000 mg | SUBCUTANEOUS | 2 refills | Status: DC
  Filled 2023-08-04: qty 2, 28d supply, fill #0
  Filled 2023-09-05: qty 2, 28d supply, fill #1
  Filled 2023-09-30 – 2023-10-06 (×2): qty 2, 28d supply, fill #2

## 2023-08-04 NOTE — Telephone Encounter (Signed)
 sent

## 2023-08-07 ENCOUNTER — Encounter: Payer: Self-pay | Admitting: Family Medicine

## 2023-08-08 ENCOUNTER — Other Ambulatory Visit: Payer: Self-pay | Admitting: Family Medicine

## 2023-08-08 MED ORDER — ALPRAZOLAM 0.5 MG PO TABS: ORAL_TABLET | ORAL | 0 refills | Status: DC

## 2023-08-08 NOTE — Telephone Encounter (Signed)
Sent xanax

## 2023-08-11 ENCOUNTER — Telehealth: Payer: Self-pay | Admitting: Family Medicine

## 2023-08-11 NOTE — Telephone Encounter (Signed)
 LVM and sent mychart msg informing pt of need to reschedule 11/03/23 appt - NP out

## 2023-08-14 ENCOUNTER — Ambulatory Visit: Admission: RE | Admit: 2023-08-14 | Discharge: 2023-08-14 | Source: Ambulatory Visit | Attending: Nurse Practitioner

## 2023-08-14 ENCOUNTER — Emergency Department (HOSPITAL_COMMUNITY)
Admission: EM | Admit: 2023-08-14 | Discharge: 2023-08-14 | Disposition: A | Discharge status: Home / Self Care | Attending: Emergency Medicine | Admitting: Emergency Medicine

## 2023-08-14 ENCOUNTER — Other Ambulatory Visit: Payer: Self-pay

## 2023-08-14 ENCOUNTER — Encounter (HOSPITAL_COMMUNITY): Payer: Self-pay | Admitting: *Deleted

## 2023-08-14 ENCOUNTER — Emergency Department (HOSPITAL_COMMUNITY)

## 2023-08-14 VITALS — BP 115/80 | HR 90 | Temp 97.8°F | Resp 16

## 2023-08-14 DIAGNOSIS — K769 Liver disease, unspecified: Secondary | ICD-10-CM

## 2023-08-14 DIAGNOSIS — R1084 Generalized abdominal pain: Secondary | ICD-10-CM | POA: Diagnosis not present

## 2023-08-14 DIAGNOSIS — R197 Diarrhea, unspecified: Secondary | ICD-10-CM | POA: Insufficient documentation

## 2023-08-14 DIAGNOSIS — I880 Nonspecific mesenteric lymphadenitis: Secondary | ICD-10-CM

## 2023-08-14 DIAGNOSIS — R748 Abnormal levels of other serum enzymes: Secondary | ICD-10-CM

## 2023-08-14 LAB — COMPREHENSIVE METABOLIC PANEL WITH GFR
ALT: 345 U/L — ABNORMAL HIGH (ref 0–44)
AST: 290 U/L — ABNORMAL HIGH (ref 15–41)
Albumin: 3.7 g/dL (ref 3.5–5.0)
Alkaline Phosphatase: 78 U/L (ref 38–126)
Anion gap: 9 (ref 5–15)
BUN: 17 mg/dL (ref 6–20)
CO2: 21 mmol/L — ABNORMAL LOW (ref 22–32)
Calcium: 8.3 mg/dL — ABNORMAL LOW (ref 8.9–10.3)
Chloride: 109 mmol/L (ref 98–111)
Creatinine, Ser: 0.64 mg/dL (ref 0.44–1.00)
GFR, Estimated: >60 mL/min (ref >60)
Glucose, Bld: 95 mg/dL (ref 70–99)
Potassium: 3.1 mmol/L — ABNORMAL LOW (ref 3.5–5.1)
Sodium: 139 mmol/L (ref 135–145)
Total Bilirubin: 0.7 mg/dL (ref 0.0–1.2)
Total Protein: 7.3 g/dL (ref 6.5–8.1)

## 2023-08-14 LAB — URINALYSIS, ROUTINE W REFLEX MICROSCOPIC
Bacteria, UA: NONE SEEN
Bilirubin Urine: NEGATIVE
Glucose, UA: NEGATIVE mg/dL
Ketones, ur: NEGATIVE mg/dL
Leukocytes,Ua: NEGATIVE
Nitrite: NEGATIVE
Protein, ur: 30 mg/dL — AB
Specific Gravity, Urine: 1.027 (ref 1.005–1.030)
pH: 5 (ref 5.0–8.0)

## 2023-08-14 LAB — CBC WITH DIFFERENTIAL/PLATELET
Abs Immature Granulocytes: 0.02 10*3/uL (ref 0.00–0.07)
Basophils Absolute: 0 10*3/uL (ref 0.0–0.1)
Basophils Relative: 0 %
Eosinophils Absolute: 0.2 10*3/uL (ref 0.0–0.5)
Eosinophils Relative: 3 %
HCT: 42.2 % (ref 36.0–46.0)
Hemoglobin: 13.6 g/dL (ref 12.0–15.0)
Immature Granulocytes: 0 %
Lymphocytes Relative: 25 %
Lymphs Abs: 1.6 10*3/uL (ref 0.7–4.0)
MCH: 27.6 pg (ref 26.0–34.0)
MCHC: 32.2 g/dL (ref 30.0–36.0)
MCV: 85.8 fL (ref 80.0–100.0)
Monocytes Absolute: 0.5 10*3/uL (ref 0.1–1.0)
Monocytes Relative: 8 %
Neutro Abs: 4.2 10*3/uL (ref 1.7–7.7)
Neutrophils Relative %: 64 %
Platelets: 312 10*3/uL (ref 150–400)
RBC: 4.92 MIL/uL (ref 3.87–5.11)
RDW: 13 % (ref 11.5–15.5)
WBC: 6.6 10*3/uL (ref 4.0–10.5)
nRBC: 0 % (ref 0.0–0.2)

## 2023-08-14 LAB — POCT URINALYSIS DIP (MANUAL ENTRY)
Glucose, UA: NEGATIVE mg/dL
Leukocytes, UA: NEGATIVE
Nitrite, UA: NEGATIVE
Protein Ur, POC: NEGATIVE mg/dL
Spec Grav, UA: 1.02
Urobilinogen, UA: NEGATIVE U/dL — AB
pH, UA: 6

## 2023-08-14 LAB — HCG, QUANTITATIVE, PREGNANCY: hCG, Beta Chain, Quant, S: <1 m[IU]/mL (ref <5)

## 2023-08-14 LAB — LIPASE, BLOOD: Lipase: 27 U/L (ref 11–51)

## 2023-08-14 MED ORDER — ONDANSETRON HCL 4 MG/2ML IJ SOLN
4.0000 mg | Freq: Once | INTRAMUSCULAR | Status: AC
  Administered 2023-08-14: 4 mg via INTRAVENOUS
  Filled 2023-08-14: qty 2

## 2023-08-14 MED ORDER — ONDANSETRON 4 MG PO TBDP: 4.0000 mg | ORAL_TABLET | Freq: Three times a day (TID) | ORAL | 0 refills | Status: DC | PRN

## 2023-08-14 MED ORDER — IOHEXOL 350 MG/ML SOLN
80.0000 mL | Freq: Once | INTRAVENOUS | Status: AC | PRN
  Administered 2023-08-14: 80 mL via INTRAVENOUS

## 2023-08-14 MED ORDER — LACTATED RINGERS IV BOLUS
1000.0000 mL | Freq: Once | INTRAVENOUS | Status: AC
  Administered 2023-08-14: 1000 mL via INTRAVENOUS

## 2023-08-14 MED ORDER — DICYCLOMINE HCL 10 MG PO CAPS
10.0000 mg | ORAL_CAPSULE | Freq: Once | ORAL | Status: AC
  Administered 2023-08-14: 10 mg via ORAL
  Filled 2023-08-14: qty 1

## 2023-08-14 MED ORDER — DIPHENOXYLATE-ATROPINE 2.5-0.025 MG PO TABS: 1.0000 | ORAL_TABLET | Freq: Four times a day (QID) | ORAL | 0 refills | Status: DC | PRN

## 2023-08-14 MED ORDER — POTASSIUM CHLORIDE ER 10 MEQ PO TBCR: 10.0000 meq | EXTENDED_RELEASE_TABLET | Freq: Every day | ORAL | 0 refills | Status: DC

## 2023-08-14 MED ORDER — DICYCLOMINE HCL 20 MG PO TABS: 20.0000 mg | ORAL_TABLET | Freq: Two times a day (BID) | ORAL | 0 refills | Status: DC

## 2023-08-14 NOTE — ED Notes (Signed)
 Patient is being discharged from the Urgent Care and sent to the Emergency Department via [facility] . Per    Leath-Warren, Belen Bowers, NP  , patient is in need of higher level of care due to Diarrhea . Patient is aware and verbalizes understanding of plan of care.  Vitals:   08/14/23 1145  BP: 115/80  Pulse: 90  Resp: 16  Temp: 97.8 F (36.6 C)  SpO2: 98%

## 2023-08-14 NOTE — ED Provider Notes (Signed)
 [place] EMERGENCY DEPARTMENT AT [facility] Provider Note   CSN: [id number] Arrival date & time: 08/14/23  1226     History  Chief Complaint  Patient presents with   Diarrhea    LAFREDA CASEBEER is a 33 y.o. female.  Patient is a 33 year old female who presents emergency department the chief complaint of generalized abdominal cramping and diarrhea which has been ongoing for approximate the past week.  Patient notes that she was exposed to her child who was sick with similar symptoms.  She did have a bout of nausea and vomiting today.  She denies any dysuria or hematuria.  She has had no abnormal vaginal discharge or bleeding.  She denies any associated fever or chills.   Diarrhea Associated symptoms: abdominal pain        Home Medications Prior to Admission medications   Medication Sig Start Date End Date Taking? Authorizing Provider  cetirizine (ZYRTEC) 10 MG tablet Take 10 mg by mouth daily.    [provider]  escitalopram (LEXAPRO) 10 MG tablet Take 15 mg by mouth daily.    [provider]  fluticasone  (FLONASE ) 50 MCG/ACT nasal spray Place 2 sprays into both nostrils daily. Patient not taking: Reported on 03/11/2023 12/08/22   Leath-Warren, Belen Bowers, NP  levothyroxine  (SYNTHROID ) 25 MCG tablet Take 1 tablet (25 mcg total) by mouth daily. 12/15/22   Del Abron Abt, FNP  Omega-3 Fatty Acids (FISH OIL ) 1000 MG CAPS Take 2 capsules (2,000 mg total) by mouth daily. 12/15/22   Del Abron Abt, FNP  Prenatal Vit-Fe Fumarate-FA (PRENATAL PO) Take by mouth.    [provider]  tirzepatide  (ZEPBOUND ) 15 MG/0.5ML Pen Inject 15 mg into the skin once a week. 08/04/23   Del Orbe Polanco, Iliana, FNP      Allergies    Isometheptene-dichloral-apap    Review of Systems   Review of Systems  Gastrointestinal:  Positive for abdominal pain and diarrhea.    Physical Exam Updated Vital Signs BP 116/82 (BP Location: Right Arm)    Pulse 89   Temp 98.1 F (36.7 C) (Oral)   Resp 16   Ht 5\' 6"  (1.676 m)   Wt 110.2 kg   SpO2 99%   BMI 39.22 kg/m  Physical Exam Vitals and nursing note reviewed.  Constitutional:      Appearance: Normal appearance.  HENT:     Head: Normocephalic and atraumatic.     Nose: Nose normal.     Mouth/Throat:     Mouth: Mucous membranes are moist.  Eyes:     Extraocular Movements: Extraocular movements intact.     Conjunctiva/sclera: Conjunctivae normal.     Pupils: Pupils are equal, round, and reactive to light.  Cardiovascular:     Rate and Rhythm: Normal rate and regular rhythm.     Pulses: Normal pulses.     Heart sounds: Normal heart sounds. No murmur heard.    No gallop.  Pulmonary:     Effort: Pulmonary effort is normal. No respiratory distress.     Breath sounds: Normal breath sounds. No stridor. No wheezing, rhonchi or rales.  Abdominal:     General: Abdomen is flat. Bowel sounds are normal. There is no distension.     Palpations: Abdomen is soft.     Tenderness: There is no abdominal tenderness. There is no guarding.  Musculoskeletal:        General: Normal range of motion.     Cervical back: Normal range  of motion and neck supple.  Skin:    General: Skin is warm and dry.     Findings: No bruising or rash.  Neurological:     General: No focal deficit present.     Mental Status: She is alert and oriented to person, place, and time. Mental status is at baseline.  Psychiatric:        Mood and Affect: Mood normal.        Behavior: Behavior normal.        Thought Content: Thought content normal.        Judgment: Judgment normal.     ED Results / Procedures / Treatments   Labs (all labs ordered are listed, but only abnormal results are displayed) Labs Reviewed  COMPREHENSIVE METABOLIC PANEL WITH GFR - Abnormal; Notable for the following components:      Result Value   Potassium 3.1 (*)    CO2 21 (*)    Calcium 8.3 (*)    AST 290 (*)    ALT 345 (*)    All  other components within normal limits  URINALYSIS, ROUTINE W REFLEX MICROSCOPIC - Abnormal; Notable for the following components:   APPearance HAZY (*)    Hgb urine dipstick SMALL (*)    Protein, ur 30 (*)    All other components within normal limits  LIPASE, BLOOD  CBC WITH DIFFERENTIAL/PLATELET  HCG, QUANTITATIVE, PREGNANCY  I-STAT CHEM 8, ED    EKG None  Radiology CT ABDOMEN PELVIS W CONTRAST Result Date: 08/14/2023 CLINICAL DATA:  Diarrhea for 6 days, abdominal cramping, emesis EXAM: CT ABDOMEN AND PELVIS WITH CONTRAST TECHNIQUE: Multidetector CT imaging of the abdomen and pelvis was performed using the standard protocol following bolus administration of intravenous contrast. RADIATION DOSE REDUCTION: This exam was performed according to the departmental dose-optimization program which includes automated exposure control, adjustment of the mA and/or kV according to patient size and/or use of iterative reconstruction technique. CONTRAST:  80mL OMNIPAQUE IOHEXOL 350 MG/ML SOLN COMPARISON:  None Available. FINDINGS: Lower chest: No acute pleural or parenchymal lung disease. Hepatobiliary: Indeterminate 2.1 cm hypodensity right lobe liver image 23/2. Otherwise the liver is unremarkable. No biliary duct dilation. Prior cholecystectomy. Pancreas: Unremarkable. No pancreatic ductal dilatation or surrounding inflammatory changes. Spleen: Normal in size without focal abnormality. Adrenals/Urinary Tract: Kidneys enhance normally and symmetrically. No urinary tract calculi or obstructive uropathy within either kidney. The bladder is unremarkable, though under distension limits its evaluation. The adrenals are unremarkable. Stomach/Bowel: No bowel obstruction or ileus. Normal appendix right lower quadrant. No bowel wall thickening or inflammatory change. Vascular/Lymphatic: No significant vascular findings. Multiple nonspecific subcentimeter mesenteric lymph nodes are seen throughout the central mesentery  and right lower quadrant, likely reactive. No pathologic adenopathy. Reproductive: IUD within the endometrial cavity. The uterus and adnexal structures otherwise unremarkable. Other: No free fluid or free intraperitoneal gas. No abdominal wall hernia. Musculoskeletal: No acute or destructive bony abnormalities. Reconstructed images demonstrate no additional findings. IMPRESSION: 1. Nonspecific subcentimeter mesenteric lymph nodes, likely reactive. 2. Otherwise no acute intra-abdominal or intrapelvic process. Normal appendix. 3. Nonspecific 2.1 cm hypodensity right lobe liver, which could reflect a small hemangioma. Nonemergent outpatient dedicated liver CT or MRI could be considered for definitive characterization. Electronically Signed   By: Bobbye Burrow M.D.   On: 08/14/2023 15:41    Procedures Procedures    Medications Ordered in ED Medications  lactated ringers bolus 1,000 mL (1,000 mLs Intravenous New Bag/Given 08/14/23 1329)  dicyclomine  (BENTYL ) capsule 10 mg (10  mg Oral Given 08/14/23 1327)  ondansetron  (ZOFRAN ) injection 4 mg (4 mg Intravenous Given 08/14/23 1326)  iohexol (OMNIPAQUE) 350 MG/ML injection 80 mL (80 mLs Intravenous Contrast Given 08/14/23 1500)    ED Course/ Medical Decision Making/ A&P                                 Medical Decision Making Amount and/or Complexity of Data Reviewed Labs: ordered. Radiology: ordered.  Risk Prescription drug management.   This patient presents to the ED for concern of abdominal pain, diarrhea differential diagnosis includes viral gastroenteritis, infectious gastroenteritis, inflammatory gastroenteritis, acute appendicitis, cholecystitis, small obstruction, diverticulitis, ovarian torsion or cyst, PID, tumor and abscess, pyelonephritis, kidney stone    Additional history obtained:  Additional history obtained from medical records External records from outside source obtained and reviewed including none   Lab Tests:  I  Ordered, and personally interpreted labs.  The pertinent results include: No leukocytosis, no anemia, normal kidney function, elevated liver enzymes, normal lipase, unremarkable urinalysis   Imaging Studies ordered:  I ordered imaging studies including CT scan of abdomen and pelvis I independently visualized and interpreted imaging which showed no acute surgical process, hypodense liver lesion, mesenteric adenitis I agree with the radiologist interpretation   Medicines ordered and prescription drug management:  I ordered medication including Bentyl , Zofran , Lomotil, potassium for diarrheal illness, hypokalemia Reevaluation of the patient after these medicines showed that the patient improved I have reviewed the patients home medicines and have made adjustments as needed   Problem List / ED Course:  Patient is doing well at this time and is stable for discharge home.  Discussed with patient that we will treat her accordingly on an outpatient basis for her diarrheal illness.  Do not suspect a bacterial source at this point and suspect that it is viral.  Patient's blood work is unremarkable and demonstrates no associated leukocytosis.  She has had no associated melena or hematochezia.  Abdominal exam is benign at this time with no focal tenderness throughout do not suspect an acute surgical process.  She was made aware of the liver lesion and was provided with a copy of her CT scan report and will follow-up with her primary care doctor regarding this.  Strict return precautions were discussed for any new or worsening symptoms.  Patient voiced understanding to the plan and had no additional questions.   Social Determinants of Health:  None           Final Clinical Impression(s) / ED Diagnoses Final diagnoses:  None    Rx / DC Orders ED Discharge Orders     None         Emmalene Hare 08/14/23 1735    Cheyenne Cotta, MD 08/17/23 [phone]

## 2023-08-14 NOTE — Discharge Instructions (Signed)
 Please follow-up closely with your primary care doctor on an outpatient basis for reevaluation.  Please ensure that your primary care doctor reevaluate your liver function as well as obtain further imaging of your liver.  Your CT scan report was provided to you today.  Return to emergency department immediately for any new or worsening symptoms.

## 2023-08-14 NOTE — Discharge Instructions (Addendum)
 Go to the emergency department for further evaluation.

## 2023-08-14 NOTE — ED Provider Notes (Signed)
 [facility]    CSN: [id number] Arrival date & time: 08/14/23  1140      History   Chief Complaint Chief Complaint  Patient presents with   Diarrhea    Have had diarrhea and nausea for 5 days. Completely watery stool. - Entered by patient    HPI Deborah Douglas is a 33 y.o. female.   The history is provided by the patient.   Patient presents for complaints of headache, diarrhea with nausea, vomiting, and abdominal pain has been present for the past 6 days.  Patient states the nausea and vomiting started today, and also endorses "sour burps."  She reports that she has had approximately 10-20 episodes of diarrhea per day since her symptoms started.  She also complains of gas with symptoms.  Patient denies fever, chills, upper respiratory symptoms, constipation, bloody stools, melena stools, or bloating.  Patient further denies urinary symptoms.  Patient with underlying history of IBS.  States that she had it when she was a child, states that she has not had any recent episodes.  Per review of her chart, she has seen gastroenterology in the past.  Patient also states that she takes Zepbound , denies any recent increase in her dose.  She states that her symptoms started 2 days after her last dose of the medication. Past Medical History:  Diagnosis Date   Breathing difficulty    bronchitis   GERD (gastroesophageal reflux disease)    pt states she no longer has this   Irritable bowel syndrome    Migraine    Morbid obesity (HCC)    Polycystic ovarian syndrome    Urticaria    pt states this was when she had a reaction to an all natural hair product    Patient Active Problem List   Diagnosis Date Noted   Acute pain of right wrist 05/20/2023   Pain in left wrist 05/20/2023   Sprain of left wrist 05/20/2023   Left knee pain 05/19/2023   Hypothyroid 03/11/2023   Class 2 obesity with body mass index (BMI) of 39.0 to 39.9 in adult 07/01/2022   Back pain 07/01/2022   Thyroid   nodule 06/03/2022   BMI 40.0-44.9, adult (HCC) 06/03/2022   Sore throat 04/25/2022   Left maxillary sinusitis 04/21/2022   Right serous otitis media 04/21/2022   Snoring 04/11/2022   Obesity due to excess calories 04/11/2022   Encounter for general adult medical examination with abnormal findings 03/19/2022   Gestational hypertension, third trimester 07/21/2021   Thyroid  antibody positive 09/10/2016   Abnormal liver enzymes 09/10/2016   PCOS (polycystic ovarian syndrome) 09/10/2016   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   Chronic urticaria 06/09/2016   Migraine without aura and without status migrainosus, not intractable 12/05/2014   Labral tear of shoulder 08/23/2014   Migraine 05/30/2014   Diarrhea 02/05/2014   IBS (irritable bowel syndrome) 12/06/2013   H/O iron deficiency 12/06/2013   RUQ abdominal pain 03/20/2012   GERD (gastroesophageal reflux disease) 05/25/2011   Epigastric pain 05/25/2011   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      Home Medications    Prior to Admission medications   Medication Sig Start Date End Date Taking? Authorizing Provider  cetirizine (ZYRTEC) 10 MG tablet Take 10 mg by mouth daily.    [provider]  escitalopram (LEXAPRO) 10 MG tablet Take 15 mg by mouth daily.    [provider]  fluticasone  (FLONASE ) 50 MCG/ACT nasal spray Place 2 sprays into both nostrils daily. Patient not taking: Reported on 03/11/2023 12/08/22   Leath-Warren, Belen Bowers, NP  levothyroxine  (SYNTHROID ) 25 MCG tablet Take 1  tablet (25 mcg total) by mouth daily. 12/15/22   Del Abron Abt, FNP  Omega-3 Fatty Acids (FISH OIL ) 1000 MG CAPS Take 2 capsules (2,000 mg total) by mouth daily. 12/15/22   Del Abron Abt, FNP  Prenatal Vit-Fe Fumarate-FA (PRENATAL PO) Take by mouth.    [provider]  tirzepatide  (ZEPBOUND ) 15 MG/0.5ML Pen Inject 15 mg into the skin once a week. 08/04/23   Del Abron Abt, FNP    Family History Family History  Problem Relation Age of Onset   Thyroid  disease Mother    GER disease Mother    Irritable bowel syndrome Mother    Addison's disease Mother    Allergic rhinitis Mother    Sleep apnea Mother    High blood pressure Mother    High blood pressure Father    GER disease Father    Thyroid  disease Sister    Healthy Sister    Healthy Sister    Allergic rhinitis Sister    Sleep apnea Sister    Allergic rhinitis Maternal Grandmother    Angioedema Neg Hx    Asthma Neg Hx    Eczema Neg Hx    Immunodeficiency Neg Hx    Urticaria Neg Hx     Social History Social History   Tobacco Use   Smoking status: Never   Smokeless tobacco: Never  Vaping Use   Vaping status: Never Used  Substance Use Topics   Alcohol use: No   Drug use: No     Allergies   Isometheptene-dichloral-apap   Review of Systems Review of Systems Per HPI  Physical Exam Triage Vital Signs ED Triage Vitals  Encounter Vitals Group     BP 08/14/23 1145 115/80     Systolic BP Percentile --      Diastolic BP Percentile --      Pulse Rate 08/14/23 1145 90     Resp 08/14/23 1145 16     Temp 08/14/23 1145 97.8 F (36.6 C)     Temp Source 08/14/23 1145 Oral     SpO2 08/14/23 1145 98 %     Weight --      Height --      Head Circumference --      Peak Flow --      Pain Score 08/14/23 1146 3     Pain Loc --      Pain Education --      Exclude from Growth Chart --    No data found.  Updated Vital Signs BP 115/80 (BP Location: Right Arm)   Pulse 90   Temp 97.8  F (36.6 C) (Oral)   Resp 16   SpO2 98%   Visual Acuity Right Eye Distance:   Left Eye Distance:   Bilateral Distance:    Right Eye Near:   Left Eye Near:    Bilateral Near:  Physical Exam Vitals and nursing note reviewed.  Constitutional:      General: She is not in acute distress.    Appearance: Normal appearance.  HENT:     Head: Normocephalic.  Eyes:     Extraocular Movements: Extraocular movements intact.     Pupils: Pupils are equal, round, and reactive to light.  Cardiovascular:     Rate and Rhythm: Normal rate and regular rhythm.     Pulses: Normal pulses.     Heart sounds: Normal heart sounds.  Pulmonary:     Effort: Pulmonary effort is normal. No respiratory distress.     Breath sounds: Normal breath sounds. No stridor. No wheezing, rhonchi or rales.  Abdominal:     General: Bowel sounds are normal.     Palpations: Abdomen is soft.     Tenderness: There is no abdominal tenderness.  Musculoskeletal:     Cervical back: Normal range of motion.  Lymphadenopathy:     Cervical: No cervical adenopathy.  Skin:    General: Skin is warm and dry.  Neurological:     General: No focal deficit present.     Mental Status: She is alert and oriented to person, place, and time.  Psychiatric:        Mood and Affect: Mood normal.        Behavior: Behavior normal.      UC Treatments / Results  Labs (all labs ordered are listed, but only abnormal results are displayed) Labs Reviewed  POCT URINALYSIS DIP (MANUAL ENTRY) - Abnormal; Notable for the following components:      Result Value   Color, UA straw (*)    Clarity, UA cloudy (*)    Bilirubin, UA small (*)    Ketones, POC UA small (15) (*)    Blood, UA moderate (*)    Urobilinogen, UA negative (*)    All other components within normal limits    EKG   Radiology No results found.  Procedures Procedures (including critical care time)  Medications Ordered in UC Medications - No data to  display  Initial Impression / Assessment and Plan / UC Course  I have reviewed the triage vital signs and the nursing notes.  Pertinent labs & imaging results that were available during my care of the patient were reviewed by me and considered in my medical decision making (see chart for details).  Patient presents for complaints of diarrhea that been present for the past 6 days.  Patient states she has been experiencing 10-20 episodes daily since her symptoms started.  On exam, her lung sounds are clear throughout, abdomen is nontender, urinalysis was performed which did not show dehydration, she does have moderate blood and small ketones.  Patient also with history of IBS.  Given the patient's current presentation, there is concern for electrolyte imbalance given the number of diarrhea episodes she has been experiencing.  Patient does not appear to be dehydrated at this time.  Based on patient's symptoms, it is recommended that she follow-up in the emergency department for further evaluation.  Patient was in agreement with this plan of care and verbalized understanding.  All questions were answered.  Patient's vital signs were stable, patient is ambulatory at discharge.  Patient discharged to the emergency department.  Final Clinical Impressions(s) / UC Diagnoses   Final diagnoses:  Diarrhea, unspecified type   Discharge Instructions   None    ED Prescriptions   None    PDMP not reviewed this encounter.  Hardy Lia, NP 08/14/23 [phone]

## 2023-08-14 NOTE — ED Triage Notes (Signed)
 Pt with diarrhea x 6 days, + abd cramping.  States her 3 year with diarrhea for 2 days. Emesis x 1 this morning.

## 2023-08-14 NOTE — ED Triage Notes (Signed)
 Diarrhea x 6 days.  Feels nauseated. C/o headache.

## 2023-08-15 LAB — HEPATITIS PANEL, ACUTE
HCV Ab: NONREACTIVE
Hep A IgM: NONREACTIVE
Hep B C IgM: NONREACTIVE
Hepatitis B Surface Ag: NONREACTIVE

## 2023-08-16 ENCOUNTER — Other Ambulatory Visit (HOSPITAL_COMMUNITY): Payer: Self-pay

## 2023-08-23 ENCOUNTER — Other Ambulatory Visit (HOSPITAL_COMMUNITY): Payer: Self-pay

## 2023-08-25 ENCOUNTER — Encounter: Payer: Self-pay | Admitting: Family Medicine

## 2023-08-25 ENCOUNTER — Ambulatory Visit: Admitting: Family Medicine

## 2023-08-25 VITALS — BP 108/73 | HR 86 | Ht 66.0 in | Wt 239.0 lb

## 2023-08-25 DIAGNOSIS — Z09 Encounter for follow-up examination after completed treatment for conditions other than malignant neoplasm: Secondary | ICD-10-CM

## 2023-08-25 DIAGNOSIS — R748 Abnormal levels of other serum enzymes: Secondary | ICD-10-CM

## 2023-08-25 NOTE — Patient Instructions (Addendum)
 Your imaging showed a small area (2.1 cm) in the right side of your liver that looks a little different than the surrounding tissue. This is called a 'hypodensity,' and in many cases, it turns out to be something harmless, ike a small liver hemangioma. A hemangioma is a common, non cancerous cluster of blood vessels in the liver.  However, the scan couldn't say for sure what it is, A MRI is recommended as an outpatient. This isn't an emergency, but it's important to follow up so we can clearly identify what it is and make sure it's nothing serious

## 2023-08-25 NOTE — Progress Notes (Signed)
   Established Patient Office Visit   Subjective  Patient ID: Deborah Douglas, female    DOB: 04-04-1990  Age: 33 y.o. MRN: [id number]  Chief Complaint  Patient presents with   Follow-up    ER follow up    She  has a past medical history of Breathing difficulty, GERD (gastroesophageal reflux disease), Irritable bowel syndrome, Migraine, Morbid obesity (HCC), Polycystic ovarian syndrome, and Urticaria.  HPI Patient presents to the clinic for ED follow up for elevated liver enzymes. For the details of today's visit, please refer to assessment and plan.    Review of Systems  Constitutional:  Negative for chills and fever.  Respiratory:  Negative for shortness of breath.   Cardiovascular:  Negative for chest pain.  Gastrointestinal:  Negative for abdominal pain and diarrhea.      Objective:     BP 108/73   Pulse 86   Ht 5\' 6"  (1.676 m)   Wt 239 lb (108.4 kg)   SpO2 96%   BMI 38.58 kg/m  BP Readings from Last 3 Encounters:  08/25/23 108/73  08/14/23 116/82  08/14/23 115/80      Physical Exam Vitals reviewed.  Constitutional:      General: She is not in acute distress.    Appearance: Normal appearance. She is not ill-appearing, toxic-appearing or diaphoretic.  HENT:     Head: Normocephalic.  Eyes:     General:        Right eye: No discharge.        Left eye: No discharge.     Conjunctiva/sclera: Conjunctivae normal.  Cardiovascular:     Rate and Rhythm: Normal rate.     Pulses: Normal pulses.     Heart sounds: Normal heart sounds.  Pulmonary:     Effort: Pulmonary effort is normal. No respiratory distress.     Breath sounds: Normal breath sounds.  Abdominal:     General: Bowel sounds are normal.     Palpations: Abdomen is soft.     Tenderness: There is no abdominal tenderness. There is no right CVA tenderness, left CVA tenderness or guarding.  Skin:    General: Skin is warm and dry.  Neurological:     Mental Status: She is alert.  Psychiatric:        Mood  and Affect: Mood normal.        Behavior: Behavior normal.      No results found for any visits on 08/25/23.  The ASCVD Risk score (Arnett DK, et al., 2019) failed to calculate for the following reasons:   The 2019 ASCVD risk score is only valid for ages 29 to 67    Assessment & Plan:  Elevated liver enzymes -     Gamma GT -     CMP14+EGFR -     Ambulatory referral to Gastroenterology  Hospital discharge follow-up Assessment & Plan: CMP and GGT labs ordered awaitng results will follow up. For the details of today's visit, please refer to assessment and plan.  Reviews Abdominal CT with patient- referral placed to GI The hospital chart, including the discharge summary, was thoroughly reviewed Medications were thoroughly reviewed and reconciled with the patient.      Return if symptoms worsen or fail to improve.   Avelino Lek Amber Bail, FNP

## 2023-08-25 NOTE — Assessment & Plan Note (Addendum)
 CMP and GGT labs ordered awaitng results will follow up. For the details of today's visit, please refer to assessment and plan.  Reviews Abdominal CT with patient- referral placed to GI The hospital chart, including the discharge summary, was thoroughly reviewed Medications were thoroughly reviewed and reconciled with the patient.

## 2023-08-27 LAB — CMP14+EGFR
ALT: 35 IU/L — ABNORMAL HIGH (ref 0–32)
AST: 18 IU/L (ref 0–40)
Albumin: 4.6 g/dL (ref 3.9–4.9)
Alkaline Phosphatase: 66 IU/L (ref 44–121)
BUN/Creatinine Ratio: 16 (ref 9–23)
BUN: 12 mg/dL (ref 6–20)
Bilirubin Total: 0.2 mg/dL (ref 0.0–1.2)
CO2: 21 mmol/L (ref 20–29)
Calcium: 9.8 mg/dL (ref 8.7–10.2)
Chloride: 103 mmol/L (ref 96–106)
Creatinine, Ser: 0.77 mg/dL (ref 0.57–1.00)
Globulin, Total: 2.3 g/dL (ref 1.5–4.5)
Glucose: 76 mg/dL (ref 70–99)
Potassium: 4.6 mmol/L (ref 3.5–5.2)
Sodium: 139 mmol/L (ref 134–144)
Total Protein: 6.9 g/dL (ref 6.0–8.5)
eGFR: 105 mL/min/{1.73_m2} (ref >59)

## 2023-08-27 LAB — GAMMA GT: GGT: 53 IU/L (ref 0–60)

## 2023-08-30 ENCOUNTER — Ambulatory Visit: Payer: Self-pay | Admitting: Family Medicine

## 2023-09-07 ENCOUNTER — Other Ambulatory Visit (HOSPITAL_COMMUNITY): Payer: Self-pay

## 2023-09-20 HISTORY — PX: WISDOM TOOTH EXTRACTION: SHX21

## 2023-09-26 ENCOUNTER — Encounter (INDEPENDENT_AMBULATORY_CARE_PROVIDER_SITE_OTHER): Payer: Self-pay | Admitting: *Deleted

## 2023-09-30 ENCOUNTER — Encounter (HOSPITAL_COMMUNITY): Payer: Self-pay

## 2023-09-30 ENCOUNTER — Other Ambulatory Visit (HOSPITAL_COMMUNITY): Payer: Self-pay

## 2023-09-30 ENCOUNTER — Telehealth: Payer: Self-pay | Admitting: Pharmacy Technician

## 2023-09-30 NOTE — Telephone Encounter (Signed)
 Pharmacy Patient Advocate Encounter  Received notification from [employer] that Prior Authorization for Zepbound  15MG /0.5ML pen-injectors  has been APPROVED from 09/30/2023 to 09/29/2024. Ran test claim, Copay is $24.99. This test claim was processed through [facility]- copay amounts may vary at other pharmacies due to pharmacy/plan contracts, or as the patient moves through the different stages of their insurance plan.   PA #/Case ID/Reference #: [id number].

## 2023-09-30 NOTE — Telephone Encounter (Signed)
 Pharmacy Patient Advocate Encounter   Received notification from CoverMyMeds that prior authorization for Zepbound  15MG /0.5ML pen-injectors is required/requested.   Insurance verification completed.   The patient is insured through [facility] .   Per test claim: PA required; PA submitted to above mentioned insurance via LATENT Key/confirmation #/EOC [id number] Status is pending

## 2023-10-06 ENCOUNTER — Encounter (INDEPENDENT_AMBULATORY_CARE_PROVIDER_SITE_OTHER): Payer: Self-pay | Admitting: Gastroenterology

## 2023-10-06 ENCOUNTER — Other Ambulatory Visit (HOSPITAL_COMMUNITY): Payer: Self-pay

## 2023-10-06 ENCOUNTER — Ambulatory Visit (INDEPENDENT_AMBULATORY_CARE_PROVIDER_SITE_OTHER): Admitting: Gastroenterology

## 2023-10-06 ENCOUNTER — Telehealth: Payer: Self-pay | Admitting: *Deleted

## 2023-10-06 ENCOUNTER — Encounter: Payer: Self-pay | Admitting: *Deleted

## 2023-10-06 VITALS — BP 115/87 | HR 97 | Temp 97.5°F | Ht 66.0 in | Wt 236.6 lb

## 2023-10-06 DIAGNOSIS — R7989 Other specified abnormal findings of blood chemistry: Secondary | ICD-10-CM | POA: Insufficient documentation

## 2023-10-06 DIAGNOSIS — K529 Noninfective gastroenteritis and colitis, unspecified: Secondary | ICD-10-CM

## 2023-10-06 DIAGNOSIS — K769 Liver disease, unspecified: Secondary | ICD-10-CM | POA: Diagnosis not present

## 2023-10-06 NOTE — Patient Instructions (Signed)
 Schedule MRI of the abdomen with IV contrast

## 2023-10-06 NOTE — Telephone Encounter (Signed)
 [facility] PA for MRI: Case Number: [id number] Review Date: 10/06/2023 1:46:20 PM Expiration Date: N/A Status: This member's plan does not currently require notification or prior-authorization through the [employer] Notification or Prior-Authorization Program. Please contact a Customer Care Professional at [phone] if you believe the information returned to be in error.

## 2023-10-06 NOTE — Telephone Encounter (Signed)
 Per CS, order needed to be for MRI abd w& wo contrast.  [facility] PA: CPT Code 25816 Description: MRI ABDOMEN W & W/O CONTRAST Case Number: [id number] Review Date: 10/06/2023 2:14:07 PM Expiration Date: N/A Status: This member's plan does not currently require notification or prior-authorization through the [employer] Notification or Prior-Authorization Program. Please contact a Customer Care Professional at [phone] if you believe the information returned to be in error.

## 2023-10-06 NOTE — Progress Notes (Unsigned)
 Toribio Fortune, M.D. Gastroenterology & Hepatology [facility] [address] [place], [place] [address] Primary Care Physician: Terry Wilhelmena Lloyd Hilario, [place] [address] [place] [address]  Referring MD: PCP  Chief Complaint: Elevated LFTs  History of Present Illness: Deborah Douglas is a 33 y.o. female with past medical history of GERD, migraine, obesity, polycystic ovarian syndrome, who presents for evaluation of elevated LFTs.  Patient reports that for a week she presented abdominal pain and diarrhea possibly due to a virus. She vomited a couple of times. The patient denies having any fever, chills, hematochezia, melena, hematemesis, abdominal distention, jaundice, pruritus or weight loss. Due to this, patient went to the ER at [facility] on 08/14/2023.  As part of the evaluation in the ER she underwent a CT scan the abdomen and pelvis with IV contrast on 08/14/2023 which showed subcentimeter mesenteric lymph nodes, there was presence of a 2.1 cm hypodensity in the right lobe of the liver which could represent a hemangioma.  Recommended having a repeat CT liver or MRI.  Blood workup at that time showed AST of 290, ALT of 345, total bilirubin 0.7, alkaline phosphatase 78, potassium was 3.1, rest of electrolytes and renal function were within normal limits, CBC was normal as well. Patient was discharged on Lomotil  and Zofran .  Reports that shortly after her ER visit she had complete resolution of her symptoms and has not taken any more medications.  Reassuringly, repeat labs on 08/25/2023 showed an ALT of 35 and AST of 18. Previously, her LFTs were completely normal.  Last EGD:2013 Esophagus:  Mucosa of the esophagus was normal. Sawtooth appearance the GE junction. Biopsy taken for routine histology. GEJ:  36 cm Stomach:  Stomach was empty and distended very well with insufflation. Full in the proximal stomach are normal. Examination mucosa  revealed multiple polyps in gastric body fundus ranging in size from 4-8 mm. These are felt to be hyperplastic polyps. 4 of these were biopsied for histologic confirmation. Antral mucosa was normal. Pyloric channel was patent. Mucosa at  angularis was also normal. Duodenum:  Normal bulbar and post bulbar mucosa.  Path: 1. Stomach, polyp(s) - FUNDIC GLAND POLYP(S). - THERE IS NO EVIDENCE OF HELICOBACTER PYLORI, GOBLET CELL METAPLASIA, DYSPLASIA, OR MALIGNANCY. 2. Esophagogastric junction, biopsy - GASTROESOPHAGEAL JUNCTION MUCOSA WITH INFLAMMATION CONSISTENT WITH GASTROESOPHAGEAL REFLUX. - THERE IS NO EVIDENCE OF GOBLET CELL METAPLASIA, DYSPLASIA, OR MALIGNANCY.   Last Colonoscopy:never  FHx: mother has IBD, neg for any gastrointestinal/liver disease, mother kidney cancer Social: neg smoking, alcohol or illicit drug use Surgical: cholecystectomy, c-section  Past Medical History: Past Medical History:  Diagnosis Date   Breathing difficulty    bronchitis   GERD (gastroesophageal reflux disease)    pt states she no longer has this   Irritable bowel syndrome    Migraine    Morbid obesity (HCC)    Polycystic ovarian syndrome    Urticaria    pt states this was when she had a reaction to an all natural hair product    Past Surgical History: Past Surgical History:  Procedure Laterality Date   CESAREAN SECTION  2023   CHOLECYSTECTOMY  2014   ESOPHAGOGASTRODUODENOSCOPY  05/26/2011   Procedure: ESOPHAGOGASTRODUODENOSCOPY (EGD);  Surgeon: Claudis RAYMOND Rivet, MD;  [facility];  Service: Endoscopy;  Laterality: N/A;  300   KNEE ARTHROSCOPY Right 2010   left shoulder surgery Left 2015   TONSILLECTOMY AND ADENOIDECTOMY     <  51 years old    Family History: Family History  Problem Relation Age of Onset   Thyroid  disease Mother    GER disease Mother    Irritable bowel syndrome Mother    Addison's disease Mother    Allergic rhinitis Mother    Sleep apnea Mother    High  blood pressure Mother    High blood pressure Father    GER disease Father    Thyroid  disease Sister    Healthy Sister    Healthy Sister    Allergic rhinitis Sister    Sleep apnea Sister    Allergic rhinitis Maternal Grandmother    Angioedema Neg Hx    Asthma Neg Hx    Eczema Neg Hx    Immunodeficiency Neg Hx    Urticaria Neg Hx     Social History: Social History   Tobacco Use  Smoking Status Never  Smokeless Tobacco Never   Social History   Substance and Sexual Activity  Alcohol Use No   Social History   Substance and Sexual Activity  Drug Use No    Allergies: Allergies  Allergen Reactions   Isometheptene-Dichloral-Apap     Numbness: MIDRIN    Medications: Current Outpatient Medications  Medication Sig Dispense Refill   cetirizine (ZYRTEC) 10 MG tablet Take 10 mg by mouth daily.     escitalopram (LEXAPRO) 10 MG tablet Take 15 mg by mouth daily. (Patient taking differently: Take 20 mg by mouth daily.)     fluticasone  (FLONASE ) 50 MCG/ACT nasal spray Place 2 sprays into both nostrils daily. (Patient taking differently: Place 2 sprays into both nostrils as needed.) 16 g 0   levothyroxine  (SYNTHROID ) 25 MCG tablet Take 1 tablet (25 mcg total) by mouth daily. (Patient taking differently: Take 50 mcg by mouth daily.) 90 tablet 3   Omega-3 Fatty Acids (FISH OIL ) 1000 MG CAPS Take 2 capsules (2,000 mg total) by mouth daily. 120 capsule 3   Prenatal Vit-Fe Fumarate-FA (PRENATAL PO) Take by mouth. (Patient taking differently: Take by mouth daily at 6 (six) AM.)     tirzepatide  (ZEPBOUND ) 15 MG/0.5ML Pen Inject 15 mg into the skin once a week. 2 mL 2   No current facility-administered medications for this visit.    Review of Systems: GENERAL: negative for malaise, night sweats HEENT: No changes in hearing or vision, no nose bleeds or other nasal problems. NECK: Negative for lumps, goiter, pain and significant neck swelling RESPIRATORY: Negative for cough,  wheezing CARDIOVASCULAR: Negative for chest pain, leg swelling, palpitations, orthopnea GI: SEE HPI MUSCULOSKELETAL: Negative for joint pain or swelling, back pain, and muscle pain. SKIN: Negative for lesions, rash PSYCH: Negative for sleep disturbance, mood disorder and recent psychosocial stressors. HEMATOLOGY Negative for prolonged bleeding, bruising easily, and swollen nodes. ENDOCRINE: Negative for cold or heat intolerance, polyuria, polydipsia and goiter. NEURO: negative for tremor, gait imbalance, syncope and seizures. The remainder of the review of systems is noncontributory.   Physical Exam: BP 115/87 (BP Location: Left Arm, Patient Position: Sitting, Cuff Size: Large)   Pulse 97   Temp (!) 97.5 F (36.4 C) (Temporal)   Ht 5' 6 (1.676 m)   Wt 236 lb 9.6 oz (107.3 kg)   BMI 38.19 kg/m  GENERAL: The patient is AO x3, in no acute distress. HEENT: Head is normocephalic and atraumatic. EOMI are intact. Mouth is well hydrated and without lesions. NECK: Supple. No masses LUNGS: Clear to auscultation. No presence of rhonchi/wheezing/rales. Adequate chest expansion HEART: RRR, normal  s1 and s2. ABDOMEN: Soft, nontender, no guarding, no peritoneal signs, and nondistended. BS +. No masses. RECTAL EXAM: no external lesions, normal tone, no masses, brown stool without blood.*** Chaperone: EXTREMITIES: Without any cyanosis, clubbing, rash, lesions or edema. NEUROLOGIC: AOx3, no focal motor deficit. SKIN: no jaundice, no rashes   Imaging/Labs: as above  I personally reviewed and interpreted the available labs, imaging and endoscopic files.  Impression and Plan: Deborah Douglas is a 33 y.o. female with ***   All questions were answered.      Toribio Fortune, MD Gastroenterology and Hepatology [facility]

## 2023-10-10 ENCOUNTER — Ambulatory Visit (HOSPITAL_COMMUNITY)

## 2023-10-17 ENCOUNTER — Ambulatory Visit (HOSPITAL_COMMUNITY)
Admission: RE | Admit: 2023-10-17 | Discharge: 2023-10-17 | Disposition: A | Discharge status: Home / Self Care | Source: Ambulatory Visit | Attending: Gastroenterology | Admitting: Gastroenterology

## 2023-10-17 ENCOUNTER — Ambulatory Visit (INDEPENDENT_AMBULATORY_CARE_PROVIDER_SITE_OTHER): Payer: Self-pay | Admitting: Gastroenterology

## 2023-10-17 DIAGNOSIS — K769 Liver disease, unspecified: Secondary | ICD-10-CM | POA: Insufficient documentation

## 2023-10-17 MED ORDER — GADOBUTROL 1 MMOL/ML IV SOLN
10.0000 mL | Freq: Once | INTRAVENOUS | Status: AC | PRN
  Administered 2023-10-17: 10 mL via INTRAVENOUS

## 2023-10-25 NOTE — Telephone Encounter (Signed)
 Pt was called, a vm was left asking pt to call and reschedule the auto pap f/u

## 2023-10-31 ENCOUNTER — Other Ambulatory Visit: Payer: Self-pay | Admitting: Family Medicine

## 2023-11-01 MED ORDER — ZEPBOUND 15 MG/0.5ML ~~LOC~~ SOAJ
15.0000 mg | SUBCUTANEOUS | 2 refills | Status: DC
  Filled 2023-11-01: qty 2, 28d supply, fill #0
  Filled 2023-11-28: qty 2, 28d supply, fill #1
  Filled 2023-12-28: qty 2, 28d supply, fill #2

## 2023-11-02 ENCOUNTER — Other Ambulatory Visit (HOSPITAL_COMMUNITY): Payer: Self-pay

## 2023-11-03 ENCOUNTER — Ambulatory Visit: Payer: 59 | Admitting: Family Medicine

## 2023-11-10 ENCOUNTER — Encounter: Payer: Self-pay | Admitting: *Deleted

## 2023-11-11 ENCOUNTER — Ambulatory Visit: Admitting: Family Medicine

## 2023-11-11 ENCOUNTER — Encounter: Payer: Self-pay | Admitting: Family Medicine

## 2023-11-11 VITALS — BP 118/82 | HR 90 | Ht 66.0 in | Wt 236.6 lb

## 2023-11-11 DIAGNOSIS — G4733 Obstructive sleep apnea (adult) (pediatric): Secondary | ICD-10-CM

## 2023-11-11 DIAGNOSIS — R5383 Other fatigue: Secondary | ICD-10-CM

## 2023-11-11 NOTE — Progress Notes (Signed)
 PATIENT: Deborah Douglas DOB: 1991/03/11  REASON FOR VISIT: follow up HISTORY FROM: patient  Chief Complaint  Patient presents with   Follow-up    Rm 1, alone.  Pt spoke to [facility] is DME said to bump up pap top pressure by 1 .       HISTORY OF PRESENT ILLNESS:  11/11/23 ALL:  Deborah Douglas is a 33 y.o. female here today for follow up for OSA on CPAP. She continues to do well on therapy. She is using CPAP nightly for about 9.5 hours, on average. She does note significant improvement in sleep quality and daytime energy. She continues to have some fatigue. AHI has been a little higher. She switched from [facility] to a modified FFM due to mask collapsing when she was on her side. Modified FFM works better but she is unsure if she is using the correct size. Now having more leaks. She was given fitting kit with four different mask sizes. Working with PCP on managing anxiety.       HISTORY: (copied from Dr Obie previous note)  Deborah Douglas is a 33 year old female with an underlying medical history of reflux disease, PCOS, urticaria, migraine headaches, irritable bowel syndrome, and obesity, who presents for follow-up consultation of obstructive sleep apnea after interim testing and starting home AutoPap therapy.  The patient is unaccompanied today.  I first met her at the request of her primary care provider on 06/15/2022, at which time she reported snoring and excessive daytime somnolence.  She was advised to proceed with a sleep study.  She had a home sleep test on 08/02/2022 which indicated overall mild obstructive sleep apnea with a total AHI of 12.4/hour and O2 nadir of 85%. Snoring was detected, ranging from mild to loud.  She was encouraged to try home AutoPap therapy.  Her set update was 08/26/22. She has a ResMed air sense 10 AutoSet machine, her DME provider is [facility].   Today, 10/28/2022: I reviewed her AutoPap compliance data from 10/28/2022 through 10/26/2022, which is a total of 30 days,  during which time she used her machine every night with percent usage days greater than 4 hours at 97%, indicating excellent compliance with an average usage of 8 hours and 45 minutes, residual AHI borderline but at goal at 4.6/h, average pressure for the 95th percentile at 11.5 cm with a range of 5 to 12 cm with EPR of 3.  Leaks acceptable with a 95th percentile at 14.8 L/min.  She reports doing well, she is adjusted to treatment and feels like her sleep is more consolidated, she does not tend to wake up as many times in the middle of the night.  She reports that her significant other has noted that she snores very little and is less restless.  Nevertheless, she does report still some daytime tiredness, Epworth sleep score is 7 out of 24, fatigue score is 43 out of 63.  She is working on weight loss.  She has started Wegovy  for her PCOS and has been on it for about 6 weeks now, she has lost a little bit of weight.  She is very motivated to continue with her AutoPap.  She uses a fullface mask with good tolerance.     REVIEW OF SYSTEMS: Out of a complete 14 system review of symptoms, the patient complains only of the following symptoms, fatigue, low energy, anxiety and all other reviewed systems are negative.  ESS: 11/24  ALLERGIES: Allergies  Allergen Reactions  Isometheptene-Dichloral-Apap     Numbness: MIDRIN    HOME MEDICATIONS: Outpatient Medications Prior to Visit  Medication Sig Dispense Refill   cetirizine (ZYRTEC) 10 MG tablet Take 10 mg by mouth daily.     escitalopram (LEXAPRO) 10 MG tablet Take 15 mg by mouth daily. (Patient taking differently: Take 20 mg by mouth daily.)     fluticasone  (FLONASE ) 50 MCG/ACT nasal spray Place 2 sprays into both nostrils daily. (Patient taking differently: Place 2 sprays into both nostrils as needed.) 16 g 0   levothyroxine  (SYNTHROID ) 25 MCG tablet Take 1 tablet (25 mcg total) by mouth daily. (Patient taking differently: Take 50 mcg by mouth daily.)  90 tablet 3   Omega-3 Fatty Acids (FISH OIL ) 1000 MG CAPS Take 2 capsules (2,000 mg total) by mouth daily. 120 capsule 3   Prenatal Vit-Fe Fumarate-FA (PRENATAL PO) Take by mouth. (Patient taking differently: Take by mouth daily at 6 (six) AM.)     tirzepatide  (ZEPBOUND ) 15 MG/0.5ML Pen Inject 15 mg into the skin once a week. 2 mL 2   No facility-administered medications prior to visit.    PAST MEDICAL HISTORY: Past Medical History:  Diagnosis Date   Breathing difficulty    bronchitis   Gastroenteritis 10/06/2023   GERD (gastroesophageal reflux disease)    pt states she no longer has this   Irritable bowel syndrome    Migraine    Morbid obesity (HCC)    Polycystic ovarian syndrome    Urticaria    pt states this was when she had a reaction to an all natural hair product    PAST SURGICAL HISTORY: Past Surgical History:  Procedure Laterality Date   CESAREAN SECTION  2023   CHOLECYSTECTOMY  2014   ESOPHAGOGASTRODUODENOSCOPY  05/26/2011   Procedure: ESOPHAGOGASTRODUODENOSCOPY (EGD);  Surgeon: Claudis RAYMOND Rivet, MD;  [facility];  Service: Endoscopy;  Laterality: N/A;  300   KNEE ARTHROSCOPY Right 2010   left shoulder surgery Left 2015   TONSILLECTOMY AND ADENOIDECTOMY     <71 years old    FAMILY HISTORY: Family History  Problem Relation Age of Onset   Thyroid  disease Mother    GER disease Mother    Irritable bowel syndrome Mother    Addison's disease Mother    Allergic rhinitis Mother    Sleep apnea Mother    High blood pressure Mother    High blood pressure Father    GER disease Father    Thyroid  disease Sister    Healthy Sister    Healthy Sister    Allergic rhinitis Sister    Sleep apnea Sister    Allergic rhinitis Maternal Grandmother    Angioedema Neg Hx    Asthma Neg Hx    Eczema Neg Hx    Immunodeficiency Neg Hx    Urticaria Neg Hx     SOCIAL HISTORY: Social History   Socioeconomic History   Marital status: Media planner    Spouse name:  Not on file   Number of children: 1   Years of education: Not on file   Highest education level: Not on file  Occupational History   Not on file  Tobacco Use   Smoking status: Never   Smokeless tobacco: Never  Vaping Use   Vaping status: Never Used  Substance and Sexual Activity   Alcohol use: No   Drug use: No   Sexual activity: Never  Other Topics Concern   Not on file  Social History Narrative  Lives at home with partner and child   Right handed   Caffeine : 2-3 cups/day   Social Drivers of Health   Financial Resource Strain: Low Risk  (07/21/2021)   Received from [facility]   Overall Financial Resource Strain (CARDIA)    Difficulty of Paying Living Expenses: Not hard at all  Food Insecurity: Not on file  Transportation Needs: Not on file  Physical Activity: Not on file  Stress: No Stress Concern Present (07/21/2021)   Received from [facility] Occupational Health - Occupational Stress Questionnaire    Feeling of Stress : Not at all  Social Connections: Unknown (07/20/2021)   Received from [facility]   Social Network    Social Network: Not on file  Intimate Partner Violence: Unknown (07/12/2021)   Received from [facility]   HITS    Physically Hurt: Not on file    Insult or Talk Down To: Not on file    Threaten Physical Harm: Not on file    Scream or Curse: Not on file     PHYSICAL EXAM  Vitals:   11/11/23 1027  BP: 118/82  Pulse: 90  Weight: 236 lb 9.6 oz (107.3 kg)  Height: 5' 6 (1.676 m)   Body mass index is 38.19 kg/m.  Generalized: Well developed, in no acute distress  Cardiology: normal rate and rhythm, no murmur noted Respiratory: clear to auscultation bilaterally  Neurological examination  Mentation: Alert oriented to time, place, history taking. Follows all commands speech and language fluent Cranial nerve II-XII: Pupils were equal round reactive to light. Extraocular movements were full, visual field were full   Motor: The motor testing reveals 5 over 5 strength of all 4 extremities. Good symmetric motor tone is noted throughout.  Gait and station: Gait is normal.    DIAGNOSTIC DATA (LABS, IMAGING, TESTING) - I reviewed patient records, labs, notes, testing and imaging myself where available.      No data to display           Lab Results  Component Value Date   WBC 6.6 08/14/2023   HGB 13.6 08/14/2023   HCT 42.2 08/14/2023   MCV 85.8 08/14/2023   PLT 312 08/14/2023      Component Value Date/Time   NA 139 08/25/2023 1144   K 4.6 08/25/2023 1144   CL 103 08/25/2023 1144   CO2 21 08/25/2023 1144   GLUCOSE 76 08/25/2023 1144   GLUCOSE 95 08/14/2023 1303   BUN 12 08/25/2023 1144   CREATININE 0.77 08/25/2023 1144   CREATININE 0.77 08/24/2016 1545   CALCIUM 9.8 08/25/2023 1144   PROT 6.9 08/25/2023 1144   ALBUMIN 4.6 08/25/2023 1144   AST 18 08/25/2023 1144   ALT 35 (H) 08/25/2023 1144   ALKPHOS 66 08/25/2023 1144   BILITOT 0.2 08/25/2023 1144   GFRNONAA >60 08/14/2023 1303   GFRNONAA >89 08/24/2016 1545   GFRAA >89 08/24/2016 1545   Lab Results  Component Value Date   CHOL 165 07/13/2023   HDL 34 (L) 07/13/2023   LDLCALC 105 (H) 07/13/2023   TRIG 148 07/13/2023   CHOLHDL 4.9 (H) 07/13/2023   Lab Results  Component Value Date   HGBA1C 5.4 03/11/2023   Lab Results  Component Value Date   VITAMINB12 280 10/30/2012   Lab Results  Component Value Date   TSH 1.940 07/13/2023     ASSESSMENT AND PLAN 33 y.o. year old female  has a past medical history of Breathing  difficulty, Gastroenteritis (10/06/2023), GERD (gastroesophageal reflux disease), Irritable bowel syndrome, Migraine, Morbid obesity (HCC), Polycystic ovarian syndrome, and Urticaria. here with     ICD-10-CM   1. OSA on CPAP  G47.33 For home use only DME continuous positive airway pressure (CPAP)    2. Fatigue, unspecified type  R53.83        AUDRYNA WENDT is doing well on CPAP therapy. Compliance  report reveals excellent compliance. AHI slightly elevated. Leak present at 28l/min. I have encouraged her to work on getting a good mask seal. I will recheck download in 6 weeks. May increase max pressure if needed. She was encouraged to continue using CPAP nightly and for greater than 4 hours each night. We will update supply orders as indicated. Risks of untreated sleep apnea review and education materials provided. Healthy lifestyle habits encouraged. She will follow up in 1 year, sooner if needed. She verbalizes understanding and agreement with this plan.    Orders Placed This Encounter  Procedures   For home use only DME continuous positive airway pressure (CPAP)    Heated Humidity with all supplies as needed    Length of Need:   Lifetime    Patient has OSA or probable OSA:   Yes    Is the patient currently using CPAP in the home:   Yes    Settings:   Other see comments    CPAP supplies needed:   Mask, headgear, cushions, filters, heated tubing and water  chamber     No orders of the defined types were placed in this encounter.   I personally spent a total of 30 minutes in the care of the patient today including preparing to see the patient, getting/reviewing separately obtained history, performing a medically appropriate exam/evaluation, counseling and educating, placing orders, documenting clinical information in the EHR, independently interpreting results, and communicating results.   Greig Forbes, FNP-C 11/11/2023, 10:56 AM [facility] [address], [address] [place], [place] [address] [phone]

## 2023-11-11 NOTE — Patient Instructions (Addendum)
 Please continue using your CPAP regularly. While your insurance requires that you use CPAP at least 4 hours each night on 70% of the nights, I recommend, that you not skip any nights and use it throughout the night if you can. Getting used to CPAP and staying with the treatment long term does take time and patience and discipline. Untreated obstructive sleep apnea when it is moderate to severe can have an adverse impact on cardiovascular health and raise her risk for heart disease, arrhythmias, hypertension, congestive heart failure, stroke and diabetes. Untreated obstructive sleep apnea causes sleep disruption, nonrestorative sleep, and sleep deprivation. This can have an impact on your day to day functioning and cause daytime sleepiness and impairment of cognitive function, memory loss, mood disturbance, and problems focussing. Using CPAP regularly can improve these symptoms.  We will update supply orders, today. Work on getting a good mask seal. Contact [facility] if needed. I will recheck download in 45 days to assess leak.   Follow up in 1 year

## 2023-11-14 NOTE — Progress Notes (Signed)
 Community message sent to [facility] that CPAP supplies order placed.

## 2023-11-17 HISTORY — PX: WRIST SURGERY: SHX841

## 2023-11-28 ENCOUNTER — Other Ambulatory Visit: Payer: Self-pay

## 2023-12-28 ENCOUNTER — Ambulatory Visit

## 2023-12-28 ENCOUNTER — Other Ambulatory Visit (HOSPITAL_COMMUNITY): Payer: Self-pay

## 2023-12-29 ENCOUNTER — Other Ambulatory Visit (HOSPITAL_COMMUNITY): Payer: Self-pay

## 2023-12-29 ENCOUNTER — Encounter: Payer: Self-pay | Admitting: Internal Medicine

## 2023-12-29 ENCOUNTER — Other Ambulatory Visit: Payer: Self-pay

## 2023-12-29 ENCOUNTER — Ambulatory Visit: Payer: Self-pay | Admitting: Internal Medicine

## 2023-12-29 ENCOUNTER — Telehealth: Payer: Self-pay | Admitting: Family Medicine

## 2023-12-29 VITALS — BP 121/82 | HR 93 | Ht 66.0 in | Wt 240.4 lb

## 2023-12-29 DIAGNOSIS — E66812 Obesity, class 2: Secondary | ICD-10-CM | POA: Diagnosis not present

## 2023-12-29 DIAGNOSIS — Z6839 Body mass index (BMI) 39.0-39.9, adult: Secondary | ICD-10-CM | POA: Diagnosis not present

## 2023-12-29 DIAGNOSIS — E039 Hypothyroidism, unspecified: Secondary | ICD-10-CM

## 2023-12-29 DIAGNOSIS — Z23 Encounter for immunization: Secondary | ICD-10-CM

## 2023-12-29 MED ORDER — ZEPBOUND 15 MG/0.5ML ~~LOC~~ SOAJ
15.0000 mg | SUBCUTANEOUS | 5 refills | Status: AC
  Filled 2023-12-29 – 2024-01-25 (×2): qty 2, 28d supply, fill #0
  Filled 2024-02-25: qty 2, 28d supply, fill #1
  Filled 2024-03-24: qty 2, 28d supply, fill #2

## 2023-12-29 NOTE — Telephone Encounter (Signed)
 Will you please attach 90 day report? TY!

## 2023-12-29 NOTE — Patient Instructions (Signed)
 Please continue taking Zepbound  as prescribed.  Please continue to take other medications as prescribed.  Please continue to follow low carb diet and perform moderate exercise/walking at least 150 mins/week.  Please get fasting blood tests done before the next visit.

## 2023-12-29 NOTE — Telephone Encounter (Signed)
 Deborah Douglas

## 2023-12-29 NOTE — Progress Notes (Signed)
 Established Patient Office Visit  Subjective:  Patient ID: Deborah Douglas, female    DOB: 1991-02-15  Age: 33 y.o. MRN: [id number]  CC:  Chief Complaint  Patient presents with   Thyroid  Problem    Follow up   Obesity    Follow up     HPI Deborah Douglas is a 33 y.o. female with past medical history of migraine, Hashimoto's thyroiditis, GAD and obesity who presents for f/u of her chronic medical conditions.  Hashimoto's thyroiditis: She is on levothyroxine  50 mcg QD.  She was evaluated by endocrinology, and was told to have target TSH below 3.  Her dose was adjusted to 50 mcg recently.  Denies any tremors, palpitations or diarrhea currently.  She has noticed improvement in her fatigue since increasing dose of levothyroxine .  Obesity: She is tolerating Zepbound  well.  She recently had to stop Zepbound  for her hand surgery.  She is motivated to continue Zepbound  15 mg QW along with low-carb diet and increasing physical activity.  Denies any major nausea or vomiting.  GAD: She takes Lexapro 20 mg QD currently, which is prescribed by her OB/GYN.      Past Medical History:  Diagnosis Date   Breathing difficulty    bronchitis   Gastroenteritis 10/06/2023   GERD (gastroesophageal reflux disease)    pt states she no longer has this   Irritable bowel syndrome    Migraine    Morbid obesity (HCC)    Polycystic ovarian syndrome    Urticaria    pt states this was when she had a reaction to an all natural hair product    Past Surgical History:  Procedure Laterality Date   CESAREAN SECTION  2023   CHOLECYSTECTOMY  2014   ESOPHAGOGASTRODUODENOSCOPY  05/26/2011   Procedure: ESOPHAGOGASTRODUODENOSCOPY (EGD);  Surgeon: Claudis RAYMOND Rivet, MD;  Location: [facility];  Service: Endoscopy;  Laterality: N/A;  300   KNEE ARTHROSCOPY Right 2010   left shoulder surgery Left 2015   TONSILLECTOMY AND ADENOIDECTOMY     <64 years old    Family History  Problem Relation Age of Onset   Thyroid   disease Mother    GER disease Mother    Irritable bowel syndrome Mother    Addison's disease Mother    Allergic rhinitis Mother    Sleep apnea Mother    High blood pressure Mother    High blood pressure Father    GER disease Father    Thyroid  disease Sister    Healthy Sister    Healthy Sister    Allergic rhinitis Sister    Sleep apnea Sister    Allergic rhinitis Maternal Grandmother    Angioedema Neg Hx    Asthma Neg Hx    Eczema Neg Hx    Immunodeficiency Neg Hx    Urticaria Neg Hx     Social History   Socioeconomic History   Marital status: Media planner    Spouse name: Not on file   Number of children: 1   Years of education: Not on file   Highest education level: Not on file  Occupational History   Not on file  Tobacco Use   Smoking status: Never   Smokeless tobacco: Never  Vaping Use   Vaping status: Never Used  Substance and Sexual Activity   Alcohol use: No   Drug use: No   Sexual activity: Never  Other Topics Concern   Not on file  Social History Narrative   Lives at  home with partner and child   Right handed   Caffeine : 2-3 cups/day   Social Drivers of Corporate investment banker Strain: Low Risk  (07/21/2021)   Received from [facility]   Overall Financial Resource Strain (CARDIA)    Difficulty of Paying Living Expenses: Not hard at all  Food Insecurity: Not on file  Transportation Needs: Not on file  Physical Activity: Not on file  Stress: No Stress Concern Present (07/21/2021)   Received from [facility] - Occupational Stress Questionnaire    Feeling of Stress : Not at all  Social Connections: Unknown (07/20/2021)   [facility]   Social Network    Social Network: Not on file  Intimate Partner Violence: Unknown (07/12/2021)   Received from [facility]   HITS    Physically Hurt: Not on file    Insult or Talk Down To: Not on file    Threaten Physical Harm: Not on file    Scream  or Curse: Not on file    Outpatient Medications Prior to Visit  Medication Sig Dispense Refill   cetirizine (ZYRTEC) 10 MG tablet Take 10 mg by mouth daily.     escitalopram (LEXAPRO) 20 MG tablet Take 20 mg by mouth daily.     fluticasone  (FLONASE ) 50 MCG/ACT nasal spray Place 2 sprays into both nostrils daily. (Patient taking differently: Place 2 sprays into both nostrils as needed.) 16 g 0   levothyroxine  (SYNTHROID ) 50 MCG tablet Take 50 mcg by mouth daily before breakfast.     Omega-3 Fatty Acids (FISH OIL ) 1000 MG CAPS Take 2 capsules (2,000 mg total) by mouth daily. 120 capsule 3   Prenatal Vit-Fe Fumarate-FA (PRENATAL PO) Take by mouth. (Patient taking differently: Take by mouth daily at 6 (six) AM.)     escitalopram (LEXAPRO) 10 MG tablet Take 15 mg by mouth daily. (Patient taking differently: Take 20 mg by mouth daily.)     levothyroxine  (SYNTHROID ) 25 MCG tablet Take 1 tablet (25 mcg total) by mouth daily. (Patient taking differently: Take 50 mcg by mouth daily.) 90 tablet 3   tirzepatide  (ZEPBOUND ) 15 MG/0.5ML Pen Inject 15 mg into the skin once a week. 2 mL 2   No facility-administered medications prior to visit.    Allergies  Allergen Reactions   Isometheptene-Dichloral-Apap     Numbness: MIDRIN    ROS Review of Systems  Constitutional:  Negative for chills and fever.  HENT:  Negative for congestion, sinus pressure, sinus pain and sore throat.   Eyes:  Negative for pain and discharge.  Respiratory:  Negative for cough and shortness of breath.   Cardiovascular:  Negative for chest pain and palpitations.  Gastrointestinal:  Negative for abdominal pain, diarrhea, nausea and vomiting.  Endocrine: Negative for polydipsia and polyuria.  Genitourinary:  Negative for dysuria and hematuria.  Musculoskeletal:  Negative for neck pain and neck stiffness.  Skin:  Negative for rash.  Neurological:  Negative for dizziness and weakness.  Psychiatric/Behavioral:  Negative for  agitation and behavioral problems.       Objective:    Physical Exam Vitals reviewed.  Constitutional:      General: She is not in acute distress.    Appearance: She is obese. She is not diaphoretic.  HENT:     Head: Normocephalic and atraumatic.     Nose: Nose normal. No congestion.     Mouth/Throat:     Mouth: Mucous membranes are moist.  Pharynx: No posterior oropharyngeal erythema.  Eyes:     General: No scleral icterus.    Extraocular Movements: Extraocular movements intact.  Cardiovascular:     Rate and Rhythm: Normal rate and regular rhythm.     Heart sounds: No murmur heard. Pulmonary:     Breath sounds: Normal breath sounds. No wheezing or rales.  Musculoskeletal:     Cervical back: Neck supple. No tenderness.     Right lower leg: No edema.     Left lower leg: No edema.  Skin:    General: Skin is warm.     Findings: No rash.  Neurological:     General: No focal deficit present.     Mental Status: She is alert and oriented to person, place, and time.  Psychiatric:        Mood and Affect: Mood normal.        Behavior: Behavior normal.     BP 121/82   Pulse 93   Ht 5' 6 (1.676 m)   Wt 240 lb 6.4 oz (109 kg)   SpO2 98%   BMI 38.80 kg/m  Wt Readings from Last 3 Encounters:  12/29/23 240 lb 6.4 oz (109 kg)  11/11/23 236 lb 9.6 oz (107.3 kg)  10/06/23 236 lb 9.6 oz (107.3 kg)    Lab Results  Component Value Date   TSH 1.940 07/13/2023   Lab Results  Component Value Date   WBC 6.6 08/14/2023   HGB 13.6 08/14/2023   HCT 42.2 08/14/2023   MCV 85.8 08/14/2023   PLT 312 08/14/2023   Lab Results  Component Value Date   NA 139 08/25/2023   K 4.6 08/25/2023   CO2 21 08/25/2023   GLUCOSE 76 08/25/2023   BUN 12 08/25/2023   CREATININE 0.77 08/25/2023   BILITOT 0.2 08/25/2023   ALKPHOS 66 08/25/2023   AST 18 08/25/2023   ALT 35 (H) 08/25/2023   PROT 6.9 08/25/2023   ALBUMIN 4.6 08/25/2023   CALCIUM 9.8 08/25/2023   ANIONGAP 9 08/14/2023    EGFR 105 08/25/2023   Lab Results  Component Value Date   CHOL 165 07/13/2023   Lab Results  Component Value Date   HDL 34 (L) 07/13/2023   Lab Results  Component Value Date   LDLCALC 105 (H) 07/13/2023   Lab Results  Component Value Date   TRIG 148 07/13/2023   Lab Results  Component Value Date   CHOLHDL 4.9 (H) 07/13/2023   Lab Results  Component Value Date   HGBA1C 5.4 03/11/2023      Assessment & Plan:   Problem List Items Addressed This Visit       Endocrine   Hypothyroid   Lab Results  Component Value Date   TSH 1.940 07/13/2023   On levothyroxine  50 mcg QD Check TSH and free T4 before next visit      Relevant Medications   levothyroxine  (SYNTHROID ) 50 MCG tablet   Other Relevant Orders   TSH + free T4     Other   Obesity due to excess calories - Primary   BMI Readings from Last 3 Encounters:  12/29/23 38.80 kg/m  11/11/23 38.19 kg/m  10/06/23 38.19 kg/m   She has lost about 9 lbs with Zepbound  She had to stop Zepbound  in between for few weeks for her hand surgery, has restarted it now Discussed about importance of continuing to follow low-carb diet and increase physical activity Zepbound  refills sent       Relevant  Medications   tirzepatide  (ZEPBOUND ) 15 MG/0.5ML Pen   Other Relevant Orders   CMP14+EGFR   Other Visit Diagnoses       Encounter for immunization       Relevant Orders   Flu vaccine trivalent PF, 6mos and older(Flulaval,Afluria,Fluarix,Fluzone) (Completed)       Meds ordered this encounter  Medications   tirzepatide  (ZEPBOUND ) 15 MG/0.5ML Pen    Sig: Inject 15 mg into the skin once a week.    Dispense:  2 mL    Refill:  5    Follow-up: Return in about 4 months (around 04/30/2024) for Hypothyroidism and weight management.    Suzzane MARLA Blanch, MD

## 2023-12-29 NOTE — Assessment & Plan Note (Signed)
 Lab Results  Component Value Date   TSH 1.940 07/13/2023   On levothyroxine  50 mcg QD Check TSH and free T4 before next visit

## 2023-12-29 NOTE — Assessment & Plan Note (Signed)
 BMI Readings from Last 3 Encounters:  12/29/23 38.80 kg/m  11/11/23 38.19 kg/m  10/06/23 38.19 kg/m   She has lost about 9 lbs with Zepbound  She had to stop Zepbound  in between for few weeks for her hand surgery, has restarted it now Discussed about importance of continuing to follow low-carb diet and increase physical activity Zepbound  refills sent

## 2024-01-04 ENCOUNTER — Encounter (INDEPENDENT_AMBULATORY_CARE_PROVIDER_SITE_OTHER): Payer: Self-pay | Admitting: Gastroenterology

## 2024-01-25 ENCOUNTER — Other Ambulatory Visit (HOSPITAL_COMMUNITY): Payer: Self-pay

## 2024-02-06 NOTE — Progress Notes (Signed)
 Date of COVID positive in last 90 days:  PCP - Hilario Terry Wilhelmena Lloyd, FNP Cardiologist - n/a Neurology- Greig Forbes, NP Endocrinologist- Wyonia Goltz, DO  Chest x-ray - 02/25/23 [facility] EKG - 02/07/24 Epic/chart Stress Test - N/A ECHO - N/A Cardiac Cath - N/A Pacemaker/ICD device last checked:N/A Spinal Cord Stimulator:N/A  Bowel Prep - N/A  Sleep Study - yes CPAP -  every night  Fasting Blood Sugar - N/A Checks Blood Sugar _____ times a day  Last dose of GLP1 agonist-  Zepbound , takes Mondays. Last dose 01/23/24 GLP1 instructions:  Do not take after  02/01/24   Last dose of SGLT-2 inhibitors-  N/A SGLT-2 instructions:  Do not take after     Blood Thinner Instructions: N/A Last dose:   Time: Aspirin Instructions:N/A Last Dose:  Activity level: Can go up a flight of stairs and perform activities of daily living without stopping and without symptoms of chest pain or shortness of breath.  Anesthesia review: N/A  Patient denies shortness of breath, fever, cough and chest pain at PAT appointment  Patient verbalized understanding of instructions that were given to them at the PAT appointment. Patient was also instructed that they will need to review over the PAT instructions again at home before surgery.

## 2024-02-07 ENCOUNTER — Other Ambulatory Visit: Payer: Self-pay

## 2024-02-07 ENCOUNTER — Encounter (HOSPITAL_COMMUNITY)
Admission: RE | Admit: 2024-02-07 | Discharge: 2024-02-07 | Disposition: A | Discharge status: Home / Self Care | Source: Ambulatory Visit | Attending: Orthopedic Surgery | Admitting: Orthopedic Surgery

## 2024-02-07 ENCOUNTER — Encounter (HOSPITAL_COMMUNITY): Payer: Self-pay

## 2024-02-07 VITALS — BP 137/93 | HR 73 | Temp 98.8°F | Resp 16 | Ht 66.0 in | Wt 240.0 lb

## 2024-02-07 DIAGNOSIS — I10 Essential (primary) hypertension: Secondary | ICD-10-CM | POA: Diagnosis not present

## 2024-02-07 DIAGNOSIS — Z01818 Encounter for other preprocedural examination: Secondary | ICD-10-CM | POA: Diagnosis not present

## 2024-02-07 DIAGNOSIS — Z01812 Encounter for preprocedural laboratory examination: Secondary | ICD-10-CM | POA: Diagnosis present

## 2024-02-07 DIAGNOSIS — Z01810 Encounter for preprocedural cardiovascular examination: Secondary | ICD-10-CM | POA: Diagnosis present

## 2024-02-07 HISTORY — DX: Autoimmune thyroiditis: E06.3

## 2024-02-07 HISTORY — DX: Anxiety disorder, unspecified: F41.9

## 2024-02-07 HISTORY — DX: Immune thrombocytopenic purpura: D69.3

## 2024-02-07 LAB — CBC
HCT: 43.7 % (ref 36.0–46.0)
Hemoglobin: 14.1 g/dL (ref 12.0–15.0)
MCH: 29 pg (ref 26.0–34.0)
MCHC: 32.3 g/dL (ref 30.0–36.0)
MCV: 89.7 fL (ref 80.0–100.0)
Platelets: 350 K/uL (ref 150–400)
RBC: 4.87 MIL/uL (ref 3.87–5.11)
RDW: 12.9 % (ref 11.5–15.5)
WBC: 10.4 K/uL (ref 4.0–10.5)
nRBC: 0 % (ref 0.0–0.2)

## 2024-02-07 LAB — BASIC METABOLIC PANEL WITH GFR
Anion gap: 10 (ref 5–15)
BUN: 10 mg/dL (ref 6–20)
CO2: 23 mmol/L (ref 22–32)
Calcium: 9.5 mg/dL (ref 8.9–10.3)
Chloride: 103 mmol/L (ref 98–111)
Creatinine, Ser: 0.7 mg/dL (ref 0.44–1.00)
GFR, Estimated: >60 mL/min (ref >60)
Glucose, Bld: 84 mg/dL (ref 70–99)
Potassium: 4.6 mmol/L (ref 3.5–5.1)
Sodium: 136 mmol/L (ref 135–145)

## 2024-02-07 LAB — SURGICAL PCR SCREEN
MRSA, PCR: NEGATIVE
Staphylococcus aureus: NEGATIVE

## 2024-02-07 NOTE — Patient Instructions (Addendum)
 SURGICAL WAITING ROOM VISITATION  Patients having surgery or a procedure may have no more than 2 support people in the waiting area - these visitors may rotate.    Children under the age of 44 must have an adult with them who is not the patient.  Visitors with respiratory illnesses are discouraged from visiting and should remain at home.  If the patient needs to stay at the hospital during part of their recovery, the visitor guidelines for inpatient rooms apply. Pre-op nurse will coordinate an appropriate time for 1 support person to accompany patient in pre-op.  This support person may not rotate.    Please refer to the [facility] website for the visitor guidelines for Inpatients (after your surgery is over and you are in a regular room).    Your procedure is scheduled on: 02/09/24   Report to [facility] Main Entrance    Report to admitting at 9:15 AM   Call this number if you have problems the morning of surgery [phone]   Do not eat food :After Midnight.   After Midnight you may have the following liquids until 8:45 AM DAY OF SURGERY  Water  Non-Citrus Juices (without pulp, NO RED-Apple, White grape, White cranberry) Black Coffee (NO MILK/CREAM OR CREAMERS, sugar ok)  Clear Tea (NO MILK/CREAM OR CREAMERS, sugar ok) regular and decaf                             Plain Jell-O (NO RED)                                           Fruit ices (not with fruit pulp, NO RED)                                     Popsicles (NO RED)                                                               Sports drinks like Gatorade (NO RED)                 The day of surgery:  Drink ONE (1) Pre-Surgery Clear Ensure at 8:45 AM the morning of surgery. Drink in one sitting. Do not sip.  This drink was given to you during your hospital  pre-op appointment visit. Nothing else to drink after completing the  Pre-Surgery Clear Ensure          If you have questions, please contact your  surgeon's office.   FOLLOW BOWEL PREP AND ANY ADDITIONAL PRE OP INSTRUCTIONS YOU RECEIVED FROM YOUR SURGEON'S OFFICE!!!     Oral Hygiene is also important to reduce your risk of infection.                                    Remember - BRUSH YOUR TEETH THE MORNING OF SURGERY WITH YOUR REGULAR TOOTHPASTE  DENTURES WILL BE REMOVED PRIOR TO SURGERY PLEASE DO NOT APPLY Poly grip OR  ADHESIVES!!!   Stop all vitamins and herbal supplements 7 days before surgery.   Do not take Zepbound  after 02/01/24.   Take these medicines the morning of surgery with A SIP OF WATER : Tylenol             You may not have any metal on your body including hair pins, jewelry, and body piercing             Do not wear make-up, lotions, powders, perfumes, or deodorant  Do not wear nail polish including gel and S&S, artificial/acrylic nails, or any other type of covering on natural nails including finger and toenails. If you have artificial nails, gel coating, etc. that needs to be removed by a nail salon please have this removed prior to surgery or surgery may need to be canceled/ delayed if the surgeon/ anesthesia feels like they are unable to be safely monitored.   Do not shave  48 hours prior to surgery.    Do not bring valuables to the hospital. [place] IS NOT             RESPONSIBLE   FOR VALUABLES.   Contacts, glasses, dentures or bridgework may not be worn into surgery.  DO NOT BRING YOUR HOME MEDICATIONS TO THE HOSPITAL. PHARMACY WILL DISPENSE MEDICATIONS LISTED ON YOUR MEDICATION LIST TO YOU DURING YOUR ADMISSION IN THE HOSPITAL!    Patients discharged on the day of surgery will not be allowed to drive home.  Someone NEEDS to stay with you for the first 24 hours after anesthesia.              Please read over the following fact sheets you were given: IF YOU HAVE QUESTIONS ABOUT YOUR PRE-OP INSTRUCTIONS PLEASE CALL [phone] [place].   If you received a COVID test during your pre-op visit  it  is requested that you wear a mask when out in public, stay away from anyone that may not be feeling well and notify your surgeon if you develop symptoms. If you test positive for Covid or have been in contact with anyone that has tested positive in the last 10 days please notify you surgeon.      Pre-operative 4 CHG Bath Instructions  DYNA-Hex 4 Chlorhexidine Gluconate 4% Solution Antiseptic 4 fl. oz   You can play a key role in reducing the risk of infection after surgery. Your skin needs to be as free of germs as possible. You can reduce the number of germs on your skin by washing with CHG (chlorhexidine gluconate) soap before surgery. CHG is an antiseptic soap that kills germs and continues to kill germs even after washing.   DO NOT use if you have an allergy to chlorhexidine/CHG or antibacterial soaps. If your skin becomes reddened or irritated, stop using the CHG and notify one of our RNs at   Please shower with the CHG soap starting 4 days before surgery using the following schedule:     Please keep in mind the following:  DO NOT shave, including legs and underarms, starting the day of your first shower.   You may shave your face at any point before/day of surgery.  Place clean sheets on your bed the day you start using CHG soap. Use a clean washcloth (not used since being washed) for each shower. DO NOT sleep with pets once you start using the CHG.  CHG Shower Instructions:  If you choose to wash your hair and private area, wash first with your  normal shampoo/soap.  After you use shampoo/soap, rinse your hair and body thoroughly to remove shampoo/soap residue.  Turn the water  OFF and apply about 3 tablespoons (45 ml) of CHG soap to a CLEAN washcloth.  Apply CHG soap ONLY FROM YOUR NECK DOWN TO YOUR TOES (washing for 3-5 minutes)  DO NOT use CHG soap on face, private areas, open wounds, or sores.  Pay special attention to the area where your surgery is being performed.  If you are  having back surgery, having someone wash your back for you may be helpful. Wait 2 minutes after CHG soap is applied, then you may rinse off the CHG soap.  Pat dry with a clean towel  Put on clean clothes/pajamas   If you choose to wear lotion, please use ONLY the CHG-compatible lotions on the back of this paper.     Additional instructions for the day of surgery: DO NOT APPLY any lotions, deodorants, cologne, or perfumes.   Put on clean/comfortable clothes.  Brush your teeth.  Ask your nurse before applying any prescription medications to the skin.   CHG Compatible Lotions   Aveeno Moisturizing lotion  Cetaphil Moisturizing Cream  Cetaphil Moisturizing Lotion  Clairol Herbal Essence Moisturizing Lotion, Dry Skin  Clairol Herbal Essence Moisturizing Lotion, Extra Dry Skin  Clairol Herbal Essence Moisturizing Lotion, Normal Skin  Curel Age Defying Therapeutic Moisturizing Lotion with Alpha Hydroxy  Curel Extreme Care Body Lotion  Curel Soothing Hands Moisturizing Hand Lotion  Curel Therapeutic Moisturizing Cream, Fragrance-Free  Curel Therapeutic Moisturizing Lotion, Fragrance-Free  Curel Therapeutic Moisturizing Lotion, Original Formula  Eucerin Daily Replenishing Lotion  Eucerin Dry Skin Therapy Plus Alpha Hydroxy Crme  Eucerin Dry Skin Therapy Plus Alpha Hydroxy Lotion  Eucerin Original Crme  Eucerin Original Lotion  Eucerin Plus Crme Eucerin Plus Lotion  Eucerin TriLipid Replenishing Lotion  Keri Anti-Bacterial Hand Lotion  Keri Deep Conditioning Original Lotion Dry Skin Formula Softly Scented  Keri Deep Conditioning Original Lotion, Fragrance Free Sensitive Skin Formula  Keri Lotion Fast Absorbing Fragrance Free Sensitive Skin Formula  Keri Lotion Fast Absorbing Softly Scented Dry Skin Formula  Keri Original Lotion  Keri Skin Renewal Lotion Keri Silky Smooth Lotion  Keri Silky Smooth Sensitive Skin Lotion  Nivea Body Creamy Conditioning Oil  Nivea Body Extra Enriched  Teacher, Adult Education Moisturizing Lotion Nivea Crme  Nivea Skin Firming Lotion  NutraDerm 30 Skin Lotion  NutraDerm Skin Lotion  NutraDerm Therapeutic Skin Cream  NutraDerm Therapeutic Skin Lotion  ProShield Protective Hand Cream  Provon moisturizing lotion

## 2024-02-09 ENCOUNTER — Ambulatory Visit (HOSPITAL_COMMUNITY)
Admission: RE | Admit: 2024-02-09 | Discharge: 2024-02-09 | Disposition: A | Discharge status: Home / Self Care | Attending: Orthopedic Surgery | Admitting: Orthopedic Surgery

## 2024-02-09 ENCOUNTER — Encounter (HOSPITAL_COMMUNITY)
Admission: RE | Disposition: A | Discharge status: Home / Self Care | Payer: Self-pay | Source: Home / Self Care | Attending: Orthopedic Surgery

## 2024-02-09 ENCOUNTER — Ambulatory Visit (HOSPITAL_COMMUNITY): Admitting: Anesthesiology

## 2024-02-09 ENCOUNTER — Ambulatory Visit (HOSPITAL_COMMUNITY): Admitting: Physician Assistant

## 2024-02-09 ENCOUNTER — Encounter (HOSPITAL_COMMUNITY): Payer: Self-pay | Admitting: Orthopedic Surgery

## 2024-02-09 DIAGNOSIS — M19012 Primary osteoarthritis, left shoulder: Secondary | ICD-10-CM | POA: Diagnosis not present

## 2024-02-09 DIAGNOSIS — X58XXXA Exposure to other specified factors, initial encounter: Secondary | ICD-10-CM | POA: Diagnosis not present

## 2024-02-09 DIAGNOSIS — Z79899 Other long term (current) drug therapy: Secondary | ICD-10-CM | POA: Insufficient documentation

## 2024-02-09 DIAGNOSIS — K219 Gastro-esophageal reflux disease without esophagitis: Secondary | ICD-10-CM | POA: Insufficient documentation

## 2024-02-09 DIAGNOSIS — E063 Autoimmune thyroiditis: Secondary | ICD-10-CM | POA: Diagnosis not present

## 2024-02-09 DIAGNOSIS — F419 Anxiety disorder, unspecified: Secondary | ICD-10-CM | POA: Insufficient documentation

## 2024-02-09 DIAGNOSIS — S43002A Unspecified subluxation of left shoulder joint, initial encounter: Secondary | ICD-10-CM | POA: Insufficient documentation

## 2024-02-09 HISTORY — PX: SHOULDER HEMI-ARTHROPLASTY: SHX5049

## 2024-02-09 LAB — POCT PREGNANCY, URINE: Preg Test, Ur: NEGATIVE

## 2024-02-09 SURGERY — HEMIARTHROPLASTY, SHOULDER
Anesthesia: General | Site: Shoulder | Laterality: Left

## 2024-02-09 MED ORDER — ORAL CARE MOUTH RINSE: 15.0000 mL | Freq: Once | OROMUCOSAL | Status: AC

## 2024-02-09 MED ORDER — VANCOMYCIN HCL 1000 MG IV SOLR
INTRAVENOUS | Status: AC
  Filled 2024-02-09: qty 20

## 2024-02-09 MED ORDER — PROPOFOL 10 MG/ML IV BOLUS
INTRAVENOUS | Status: DC | PRN
  Administered 2024-02-09: 200 mg via INTRAVENOUS

## 2024-02-09 MED ORDER — LIDOCAINE HCL (PF) 2 % IJ SOLN
INTRAMUSCULAR | Status: AC
  Filled 2024-02-09: qty 15

## 2024-02-09 MED ORDER — ALBUMIN HUMAN 5 % IV SOLN: INTRAVENOUS | Status: DC | PRN

## 2024-02-09 MED ORDER — PHENYLEPHRINE 80 MCG/ML (10ML) SYRINGE FOR IV PUSH (FOR BLOOD PRESSURE SUPPORT)
PREFILLED_SYRINGE | INTRAVENOUS | Status: AC
Start: 2024-02-09 — End: 2024-02-09
  Filled 2024-02-09: qty 20

## 2024-02-09 MED ORDER — ONDANSETRON HCL 4 MG PO TABS: 4.0000 mg | ORAL_TABLET | Freq: Three times a day (TID) | ORAL | 0 refills | Status: AC | PRN

## 2024-02-09 MED ORDER — OXYCODONE-ACETAMINOPHEN 5-325 MG PO TABS: 1.0000 | ORAL_TABLET | ORAL | 0 refills | Status: AC | PRN

## 2024-02-09 MED ORDER — TRANEXAMIC ACID 1000 MG/10ML IV SOLN: 1000.0000 mg | INTRAVENOUS | Status: DC

## 2024-02-09 MED ORDER — FENTANYL CITRATE (PF) 50 MCG/ML IJ SOSY
25.0000 ug | PREFILLED_SYRINGE | INTRAMUSCULAR | Status: DC | PRN
  Administered 2024-02-09 (×2): 25 ug via INTRAVENOUS

## 2024-02-09 MED ORDER — CELECOXIB 200 MG PO CAPS: 200.0000 mg | ORAL_CAPSULE | Freq: Every day | ORAL | 2 refills | Status: AC

## 2024-02-09 MED ORDER — FENTANYL CITRATE (PF) 50 MCG/ML IJ SOSY
PREFILLED_SYRINGE | INTRAMUSCULAR | Status: AC
  Filled 2024-02-09: qty 1

## 2024-02-09 MED ORDER — ROCURONIUM BROMIDE 10 MG/ML (PF) SYRINGE
PREFILLED_SYRINGE | INTRAVENOUS | Status: AC
  Filled 2024-02-09: qty 30

## 2024-02-09 MED ORDER — 0.9 % SODIUM CHLORIDE (POUR BTL) OPTIME
TOPICAL | Status: DC | PRN
  Administered 2024-02-09: 1000 mL

## 2024-02-09 MED ORDER — METOCLOPRAMIDE HCL 5 MG/ML IJ SOLN: 5.0000 mg | Freq: Three times a day (TID) | INTRAMUSCULAR | Status: DC | PRN

## 2024-02-09 MED ORDER — SUGAMMADEX SODIUM 200 MG/2ML IV SOLN
INTRAVENOUS | Status: DC | PRN
  Administered 2024-02-09: 200 mg via INTRAVENOUS

## 2024-02-09 MED ORDER — FENTANYL CITRATE (PF) 50 MCG/ML IJ SOSY
PREFILLED_SYRINGE | INTRAMUSCULAR | Status: AC
  Filled 2024-02-09: qty 2

## 2024-02-09 MED ORDER — ROCURONIUM BROMIDE 100 MG/10ML IV SOLN
INTRAVENOUS | Status: DC | PRN
  Administered 2024-02-09: 50 mg via INTRAVENOUS
  Administered 2024-02-09: 20 mg via INTRAVENOUS

## 2024-02-09 MED ORDER — FENTANYL CITRATE (PF) 100 MCG/2ML IJ SOLN
INTRAMUSCULAR | Status: AC
  Filled 2024-02-09: qty 2

## 2024-02-09 MED ORDER — METOCLOPRAMIDE HCL 5 MG PO TABS: 5.0000 mg | ORAL_TABLET | Freq: Three times a day (TID) | ORAL | Status: DC | PRN

## 2024-02-09 MED ORDER — BUPIVACAINE LIPOSOME 1.3 % IJ SUSP
INTRAMUSCULAR | Status: DC | PRN
  Administered 2024-02-09: 10 mL via PERINEURAL

## 2024-02-09 MED ORDER — ACETAMINOPHEN 500 MG PO TABS
1000.0000 mg | ORAL_TABLET | Freq: Once | ORAL | Status: DC
  Filled 2024-02-09: qty 2

## 2024-02-09 MED ORDER — PHENYLEPHRINE HCL-NACL 20-0.9 MG/250ML-% IV SOLN
INTRAVENOUS | Status: DC | PRN
  Administered 2024-02-09: 15 ug/min via INTRAVENOUS

## 2024-02-09 MED ORDER — OXYCODONE HCL 5 MG PO TABS
ORAL_TABLET | ORAL | Status: AC
  Filled 2024-02-09: qty 1

## 2024-02-09 MED ORDER — ONDANSETRON HCL 4 MG/2ML IJ SOLN
INTRAMUSCULAR | Status: AC
  Filled 2024-02-09: qty 2

## 2024-02-09 MED ORDER — CYCLOBENZAPRINE HCL 10 MG PO TABS: 10.0000 mg | ORAL_TABLET | Freq: Three times a day (TID) | ORAL | 1 refills | Status: AC | PRN

## 2024-02-09 MED ORDER — ONDANSETRON HCL 4 MG/2ML IJ SOLN
INTRAMUSCULAR | Status: AC
  Filled 2024-02-09: qty 4

## 2024-02-09 MED ORDER — PROPOFOL 10 MG/ML IV BOLUS
INTRAVENOUS | Status: AC
  Filled 2024-02-09: qty 20

## 2024-02-09 MED ORDER — SODIUM CHLORIDE 0.9 % IV SOLN: 12.5000 mg | INTRAVENOUS | Status: DC | PRN

## 2024-02-09 MED ORDER — MIDAZOLAM HCL (PF) 2 MG/2ML IJ SOLN
2.0000 mg | Freq: Once | INTRAMUSCULAR | Status: AC
  Administered 2024-02-09: 2 mg via INTRAVENOUS

## 2024-02-09 MED ORDER — BUPIVACAINE HCL (PF) 0.5 % IJ SOLN
INTRAMUSCULAR | Status: DC | PRN
  Administered 2024-02-09: 15 mL via PERINEURAL

## 2024-02-09 MED ORDER — LACTATED RINGERS IV SOLN: INTRAVENOUS | Status: DC

## 2024-02-09 MED ORDER — CEFAZOLIN SODIUM-DEXTROSE 2-4 GM/100ML-% IV SOLN
2.0000 g | INTRAVENOUS | Status: AC
  Administered 2024-02-09: 2 g via INTRAVENOUS
  Filled 2024-02-09: qty 100

## 2024-02-09 MED ORDER — OXYCODONE HCL 5 MG/5ML PO SOLN: 5.0000 mg | Freq: Once | ORAL | Status: DC | PRN

## 2024-02-09 MED ORDER — LIDOCAINE HCL (CARDIAC) PF 100 MG/5ML IV SOSY
PREFILLED_SYRINGE | INTRAVENOUS | Status: DC | PRN
  Administered 2024-02-09: 60 mg via INTRAVENOUS

## 2024-02-09 MED ORDER — AMISULPRIDE (ANTIEMETIC) 5 MG/2ML IV SOLN: 10.0000 mg | Freq: Once | INTRAVENOUS | Status: DC | PRN

## 2024-02-09 MED ORDER — LACTATED RINGERS IV SOLN: INTRAVENOUS | Status: DC | PRN

## 2024-02-09 MED ORDER — FENTANYL CITRATE (PF) 100 MCG/2ML IJ SOLN
INTRAMUSCULAR | Status: DC | PRN
  Administered 2024-02-09 (×4): 50 ug via INTRAVENOUS

## 2024-02-09 MED ORDER — STERILE WATER FOR IRRIGATION IR SOLN
Status: DC | PRN
  Administered 2024-02-09: 2000 mL

## 2024-02-09 MED ORDER — ONDANSETRON HCL 4 MG/2ML IJ SOLN
INTRAMUSCULAR | Status: DC | PRN
  Administered 2024-02-09: 4 mg via INTRAVENOUS

## 2024-02-09 MED ORDER — VANCOMYCIN HCL 1 G IV SOLR
INTRAVENOUS | Status: DC | PRN
  Administered 2024-02-09: 1000 mg via TOPICAL

## 2024-02-09 MED ORDER — CHLORHEXIDINE GLUCONATE 0.12 % MT SOLN
15.0000 mL | Freq: Once | OROMUCOSAL | Status: AC
  Administered 2024-02-09: 15 mL via OROMUCOSAL

## 2024-02-09 MED ORDER — TRANEXAMIC ACID-NACL 1000-0.7 MG/100ML-% IV SOLN
1000.0000 mg | INTRAVENOUS | Status: AC
  Administered 2024-02-09: 1000 mg via INTRAVENOUS
  Filled 2024-02-09: qty 100

## 2024-02-09 MED ORDER — MIDAZOLAM HCL 2 MG/2ML IJ SOLN
INTRAMUSCULAR | Status: DC
Start: 2024-02-09 — End: 2024-02-09
  Filled 2024-02-09: qty 2

## 2024-02-09 MED ORDER — OXYCODONE HCL 5 MG PO TABS: 5.0000 mg | ORAL_TABLET | Freq: Once | ORAL | Status: DC | PRN

## 2024-02-09 MED ORDER — FENTANYL CITRATE (PF) 50 MCG/ML IJ SOSY
100.0000 ug | PREFILLED_SYRINGE | Freq: Once | INTRAMUSCULAR | Status: AC
  Administered 2024-02-09: 50 ug via INTRAVENOUS

## 2024-02-09 MED ORDER — EPHEDRINE 5 MG/ML INJ
INTRAVENOUS | Status: AC
  Filled 2024-02-09: qty 10

## 2024-02-09 SURGICAL SUPPLY — 68 items
ANCHOR FIBERTAK 2.6X1.7 BLUE (Anchor) IMPLANT
ANCHOR SUT FBRTK 2.6 SOFT 1.7 (Anchor) IMPLANT
BAG COUNTER SPONGE SURGICOUNT (BAG) ×1 IMPLANT
BAG ZIPLOCK 12X15 (MISCELLANEOUS) ×1 IMPLANT
BLADE SAW SGTL 83.5X18.5 (BLADE) IMPLANT
BUR MICRO 7.0 ROUND (BURR) IMPLANT
COOLER ICEMAN CLASSIC (MISCELLANEOUS) IMPLANT
COVER BACK TABLE 60X90IN (DRAPES) ×1 IMPLANT
COVER SURGICAL LIGHT HANDLE (MISCELLANEOUS) ×1 IMPLANT
DERMABOND ADVANCED .7 DNX12 (GAUZE/BANDAGES/DRESSINGS) ×1 IMPLANT
DRAPE INCISE IOBAN 66X45 STRL (DRAPES) ×1 IMPLANT
DRAPE SHEET LG 3/4 BI-LAMINATE (DRAPES) ×1 IMPLANT
DRAPE SURG 17X11 SM STRL (DRAPES) ×1 IMPLANT
DRAPE SURG 17X23 STRL (DRAPES) IMPLANT
DRAPE SURG ORHT 6 SPLT 77X108 (DRAPES) ×2 IMPLANT
DRAPE TOP 10253 STERILE (DRAPES) IMPLANT
DRAPE U-SHAPE 47X51 STRL (DRAPES) ×1 IMPLANT
DRESSING AQUACEL AG SP 3.5X6 (GAUZE/BANDAGES/DRESSINGS) IMPLANT
DRSG AQUACEL AG ADV 3.5X10 (GAUZE/BANDAGES/DRESSINGS) IMPLANT
DURAPREP 26ML APPLICATOR (WOUND CARE) ×1 IMPLANT
ELECT BLADE TIP CTD 4 INCH (ELECTRODE) ×1 IMPLANT
ELECT PENCIL ROCKER SW 15FT (MISCELLANEOUS) ×1 IMPLANT
ELECT REM PT RETURN 15FT ADLT (MISCELLANEOUS) ×1 IMPLANT
FACESHIELD WRAPAROUND OR TEAM (MASK) ×4 IMPLANT
GLOVE BIO SURGEON STRL SZ7 (GLOVE) ×1 IMPLANT
GLOVE BIO SURGEON STRL SZ7.5 (GLOVE) ×1 IMPLANT
GLOVE BIO SURGEON STRL SZ8 (GLOVE) ×1 IMPLANT
GLOVE BIOGEL PI IND STRL 7.0 (GLOVE) ×1 IMPLANT
GLOVE BIOGEL PI IND STRL 8 (GLOVE) ×1 IMPLANT
GLOVE ECLIPSE 7.5 STRL STRAW (GLOVE) ×1 IMPLANT
GLOVE SS BIOGEL STRL SZ 7 (GLOVE) IMPLANT
GLOVE SS BIOGEL STRL SZ 7.5 (GLOVE) IMPLANT
GOWN SPEC L4 XLG W/TWL (GOWN DISPOSABLE) IMPLANT
GOWN STRL REUS W/ TWL LRG LVL3 (GOWN DISPOSABLE) ×2 IMPLANT
GOWN STRL SURGICAL XL XLNG (GOWN DISPOSABLE) IMPLANT
HEAD HUM HIGH OFFSET 4 (Orthopedic Implant) IMPLANT
KIT BASIN OR (CUSTOM PROCEDURE TRAY) ×1 IMPLANT
KIT TURNOVER KIT A (KITS) ×1 IMPLANT
MANIFOLD NEPTUNE II (INSTRUMENTS) ×1 IMPLANT
NDL REV CUT NITINOL 36.6 (MISCELLANEOUS) IMPLANT
NDL TAPERED W/ NITINOL LOOP (MISCELLANEOUS) ×1 IMPLANT
NEEDLE REV CUT NITINOL 36.6 (MISCELLANEOUS) ×1 IMPLANT
NEEDLE TAPERED W/ NITINOL LOOP (MISCELLANEOUS) ×1 IMPLANT
NS IRRIG 1000ML POUR BTL (IV SOLUTION) ×1 IMPLANT
PACK SHOULDER (CUSTOM PROCEDURE TRAY) ×1 IMPLANT
PAD ARMBOARD POSITIONER FOAM (MISCELLANEOUS) ×1 IMPLANT
PAD COLD SHLDR WRAP-ON (PAD) IMPLANT
PEEK SWIVELOCK SHOU 3.9 (Orthopedic Implant) IMPLANT
PIN NITINOL TARGETER 2.8 (PIN) IMPLANT
PIN SET MODULAR GLENOID SYSTEM (PIN) IMPLANT
RESTRAINT HEAD UNIVERSAL NS (MISCELLANEOUS) ×1 IMPLANT
SLING ARM FOAM STRAP LRG (SOFTGOODS) IMPLANT
SLING ARM FOAM STRAP MED (SOFTGOODS) IMPLANT
STEM HUMERAL PTC SZ1C 66 1275D (Stem) IMPLANT
STRIP CLOSURE SKIN 1/2X4 (GAUZE/BANDAGES/DRESSINGS) ×1 IMPLANT
SUCTION TUBE FRAZIER 12FR DISP (SUCTIONS) ×1 IMPLANT
SUT MNCRL AB 3-0 PS2 18 (SUTURE) ×1 IMPLANT
SUT MON AB 2-0 CT1 36 (SUTURE) ×1 IMPLANT
SUT VIC AB 0 CT1 36 (SUTURE) ×1 IMPLANT
SUT VIC AB 1 CT1 36 (SUTURE) ×1 IMPLANT
SUTURE FIBERWR #2 38 T-5 BLUE (SUTURE) IMPLANT
SUTURE TAPE 1.3 40 TPR END (SUTURE) ×2 IMPLANT
TOWEL GREEN STERILE FF (TOWEL DISPOSABLE) IMPLANT
TOWEL OR DSP ST BLU DLX 10/PK (DISPOSABLE) ×1 IMPLANT
TUBE SUCTION HIGH CAP CLEAR NV (SUCTIONS) IMPLANT
TUBING CONNECTING 10 (TUBING) IMPLANT
WATER STERILE IRR 1000ML POUR (IV SOLUTION) ×1 IMPLANT
YANKAUER SUCT BULB TIP 10FT TU (MISCELLANEOUS) ×1 IMPLANT

## 2024-02-09 NOTE — Anesthesia Procedure Notes (Signed)
 Anesthesia Regional Block: Interscalene brachial plexus block   Pre-Anesthetic Checklist: , timeout performed,  Correct Patient, Correct Site, Correct Laterality,  Correct Procedure, Correct Position, site marked,  Risks and benefits discussed,  Surgical consent,  Pre-op evaluation,  At surgeon's request and post-op pain management  Laterality: Left  Prep: chloraprep       Needles:  Injection technique: Single-shot  Needle Type: Echogenic Needle     Needle Length: 5cm  Needle Gauge: 21     Additional Needles:   Narrative:  Start time: 02/09/2024 11:32 AM End time: 02/09/2024 11:35 AM Injection made incrementally with aspirations every 5 mL.  Performed by: Personally  Anesthesiologist: Lucious Debby BRAVO, MD  Additional Notes: No pain on injection. No increased resistance to injection. Injection made in 5cc increments. Good needle visualization. Patient tolerated the procedure well.

## 2024-02-09 NOTE — Op Note (Signed)
 02/09/2024  2:57 PM  PATIENT:   Deborah Douglas  33 y.o. 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  Additionally an osteoplasty of the glenoid was performed recreating a concavity and eliminating the ridge along the neo-glenoid.  SURGEON:  Melita Franky BATTLE M.D.  ASSISTANTS: Randine Ricks, PA-C  Randine Ricks, PA-C was utilized as an geophysicist/field seismologist throughout this case, essential for help with positioning the patient, positioning extremity, tissue manipulation, implantation of the prosthesis, suture management, wound closure, and intraoperative decision-making.    ANESTHESIA:   General Endotracheal and interscalene block with Exparel  EBL: 300 cc  SPECIMEN: None  Drains: None   PATIENT DISPOSITION:  PACU - hemodynamically stable.    PLAN OF CARE: Discharge to home after PACU  Brief history:  Patient is a 33 year old female well-known to our practice with remote history of a left shoulder arthroscopic stabilization for the treatment of posterior instability, now presenting with progressive increasing left shoulder pain for the last approximate 1 year with evidence for relatively advanced glenohumeral arthritis as well as significant posterior subluxation.  Due to her significantly advanced glenohumeral joint disease she is brought to the operating today for planned left shoulder hemiarthroplasty.  Preoperatively, I counseled the patient regarding treatment options and risks versus benefits thereof.  Possible surgical complications were all reviewed including potential for bleeding, infection, neurovascular injury, persistent pain, loss of motion, anesthetic complication, failure of the implant, and possible need for additional surgery. They understand  and accept and agrees with our planned procedure.   Procedure detail:  After undergoing routine preop evaluation the patient received prophylactic antibiotics and interscalene block with Exparel was established in the holding area by the anesthesia department.  Subsequently placed spine on the operating table and underwent the smooth induction of a general endotracheal anesthesia.  Placed in the beachchair position and appropriately padded and protected.  The left shoulder girdle region was sterilely prepped and draped in standard fashion.  Timeout was called.  A deltopectoral approach was made to the left shoulder through an approximate 12 cm incision.  Skin flaps elevated dissection carried deeply the deltopectoral interval was then developed from proximal to distal with the vein taken laterally.  The conjoined tendon was mobilized and retracted medially.  Adhesions were divided beneath the deltoid.  The long head biceps tendon was then tenodesed at the upper border of the pectoralis major tendon with the proximal segment unroofed and excised.  The rotator cuff was then split from the apex of the bicipital groove to the base of the coracoid and upon entering the joint we found a large effusion as well as significant synovitis and indeed all of the peritubular soft tissues were significantly hemorrhagic with marked bleeding noted from all cut surfaces secondary to the inflammation.  We then outlined the insertion of the subscapularis into the lesser tuberosity and performed a lesser tuberosity osteotomy removing a thin wafer of bone using the oscillating saw and the subscap was intact mobilized and reflected medially.  Capsular attachments were then divided from the anterior and inferior margins of the humeral neck allowing deliver the humeral head through the wound.  An extra medullary guide was then used to outline the proposed humeral head resection which we performed with an oscillating saw at  approximately 20 degrees of  retroversion.  Marginal osteophytes removed with a rondure.  At this point we then exposed the glenoid and I excised the proximal stump of the long head biceps tendon but we maintained all of the labral attachments.  I did notice a significant bony ridge traversing the middle of the glenoid related to the posterior subluxation and given the size of the bony ridge and use the spherical bur to perform an osteoplasty/chondroplasty of the glenoid recreating a standard glenoid concavity.  The area was then fully irrigated and all debris was carefully removed.  We then returned our attention back to the humeral metaphysis where we used the Tornier ascend flex broaches and canal finder ultimately sizing to a size 1.  A trial was then placed and we sized for our humeral head and ultimately felt that the 46 x 17 high offset humeral head would give us  the best coverage of the proximal humerus as well as motion stability and soft tissue balance.  The trial was then removed.  A final implant was seated and then the pirate carbon head was impacted upon the stem with excellent fixation.  A suture was then passed around the base of the humeral head to act as our superior medial row anchor and then 2 additional suture anchors were placed just medial to the footprint of the LTO and we actually deployed these prior to the placement of her humeral stem and could visualize solid fixation.  The joint was copes irrigated.  A final reduction was then performed.  We mobilized the subscapularis and confirmed good elasticity.  A convergence stitch was then placed at the superior border of the subscap with the anterior supraspinatus and then we passed our 3 medial row suture limbs through the bone tendon junction of the LTO and these were then passed in an alternating fashion into 2 lateral row anchors placed along the bicipital groove achieving excellent fixation of the LTO with a double row technique.  The arm  easily achieved 45 degrees of external rotation without excessive tension on the LTO repair.  Final irrigation was then completed.  Hemostasis was obtained.  The deltopectoral interval was reapproximated with a series of figure-of-eight and with Vicryl sutures.  2-0 Monocryl used to close the subcu layer and intracuticular 3-0 Monocryl used to close the skin followed by Steri-Strips and a Aquacel dressing.  The left arm was placed into a sling.  The patient was awakened, extubated, and taken to the recovery room in stable condition.  Franky CHRISTELLA Pointer MD   Contact # [phone]

## 2024-02-09 NOTE — Anesthesia Procedure Notes (Signed)
 Procedure Name: Intubation Date/Time: 02/09/2024 1:06 PM  Performed by: Dartha Meckel, CRNAPre-anesthesia Checklist: Patient identified, Emergency Drugs available, Suction available and Patient being monitored Patient Re-evaluated:Patient Re-evaluated prior to induction Oxygen Delivery Method: Circle system utilized Preoxygenation: Pre-oxygenation with 100% oxygen Induction Type: IV induction Ventilation: Mask ventilation without difficulty Laryngoscope Size: Mac and 3 Grade View: Grade I Tube type: Oral Tube size: 7.5 mm Number of attempts: 1 Airway Equipment and Method: Stylet and Oral airway Placement Confirmation: ETT inserted through vocal cords under direct vision, positive ETCO2 and breath sounds checked- equal and bilateral Secured at: 22 cm Tube secured with: Tape Dental Injury: Teeth and Oropharynx as per pre-operative assessment

## 2024-02-09 NOTE — Anesthesia Preprocedure Evaluation (Addendum)
 Anesthesia Evaluation  Patient identified by MRN, date of birth, ID band Patient awake    Reviewed: Allergy & Precautions, NPO status , Patient's Chart, lab work & pertinent test results  History of Anesthesia Complications Negative for: history of anesthetic complications  Airway Mallampati: III  TM Distance: >3 FB Neck ROM: Full    Dental  (+) Dental Advisory Given, Teeth Intact   Pulmonary neg pulmonary ROS   Pulmonary exam normal        Cardiovascular hypertension, Pt. on medications Normal cardiovascular exam     Neuro/Psych  Headaches PSYCHIATRIC DISORDERS Anxiety        GI/Hepatic Neg liver ROS,GERD  Controlled,,  Endo/Other  Hypothyroidism   Obesity On GLP-1a   Renal/GU negative Renal ROS     Musculoskeletal negative musculoskeletal ROS (+)    Abdominal   Peds  Hematology negative hematology ROS (+)   Anesthesia Other Findings   Reproductive/Obstetrics  PCOS                               Anesthesia Physical Anesthesia Plan  ASA: 2  Anesthesia Plan: General   Post-op Pain Management: Tylenol PO (pre-op)* and Regional block*   Induction: Intravenous  PONV Risk Score and Plan: 3 and Treatment may vary due to age or medical condition, Ondansetron , Dexamethasone  and Midazolam   Airway Management Planned: Oral ETT  Additional Equipment: None  Intra-op Plan:   Post-operative Plan: Extubation in OR  Informed Consent: I have reviewed the patients History and Physical, chart, labs and discussed the procedure including the risks, benefits and alternatives for the proposed anesthesia with the patient or authorized representative who has indicated his/her understanding and acceptance.     Dental advisory given  Plan Discussed with: CRNA and Anesthesiologist  Anesthesia Plan Comments:          Anesthesia Quick Evaluation

## 2024-02-09 NOTE — Transfer of Care (Signed)
 Immediate Anesthesia Transfer of Care Note  Patient: Deborah Douglas  Procedure(s) Performed: HEMIARTHROPLASTY, SHOULDER (Left: Shoulder)  Patient Location: PACU  Anesthesia Type:General  Level of Consciousness: awake and alert   Airway & Oxygen Therapy: Patient Spontanous Breathing and Patient connected to face mask oxygen  Post-op Assessment: Report given to RN and Post -op Vital signs reviewed and stable  Post vital signs: Reviewed and stable  Last Vitals:  Vitals Value Taken Time  BP    Temp    Pulse 94 02/09/24 15:38  Resp 17 02/09/24 15:38  SpO2 98 % 02/09/24 15:38  Vitals shown include unfiled device data.  Last Pain:  Vitals:   02/09/24 1145  TempSrc:   PainSc: 0-No pain         Complications: No notable events documented.

## 2024-02-09 NOTE — Evaluation (Signed)
 Occupational Therapy Evaluation Patient Details Name: Deborah Douglas MRN: [id number] DOB: 1990/11/27 Today's Date: 02/09/2024   History of Present Illness   [name] is a 33 yr old female who is s/p a L shoulder hemiarthroplasty on 02-09-24, due to L shoulder OA with posterior subluxation.     Clinical Impressions Pt is s/p shoulder replacement of left non-dominant upper extremity on 02-09-24. Therapist provided education and instruction to the patient and her significant other with regards to ROM/exercise protocol, post-op precautions, upper extremity and sling positioning, donning upper extremity clothing, recommendations for bathing while maintaining shoulder precautions, use of ice for pain and edema management, non weightbearing status, use of ice machine, sling wear schedule, and donning/doffing sling. Patient and her significant other verbalized and demonstrated understanding as needed. Patient needed assistance to donn shirt, underwear, pants, socks, and shoes, with instruction provided on compensatory strategies to perform ADLs. Patient to follow physician's recommendations for discharge plan and the need for potential follow up therapy.     If plan is discharge home, recommend the following:   Help with stairs or ramp for entrance;Assist for transportation;Assistance with cooking/housework;A little help with bathing/dressing/bathroom     Functional Status Assessment   Patient has had a recent decline in their functional status and demonstrates the ability to make significant improvements in function in a reasonable and predictable amount of time.     Equipment Recommendations   Tub/shower seat     Recommendations for Other Services         Precautions/Restrictions   Precautions Precautions: Shoulder Type of Shoulder Precautions: If sitting in controlled environment, ok to come out of sling to give neck a break. Please sleep in it to protect until follow up in  office.     OK to use operative arm for feeding, hygiene and ADLs.   Ok to instruct Pendulums and lap slides as exercises. Ok to use operative arm within the following parameters for ADL purposes     New ROM (1/81)   Ok for PROM, AAROM within pain tolerance and within the following ROM   ER 20   ABD 45   FE 60 Shoulder Interventions: Shoulder sling/immobilizer Precaution Booklet Issued: Yes (comment) Recall of Precautions/Restrictions: Intact Required Braces or Orthoses: Sling Restrictions Weight Bearing Restrictions Per Provider Order: Yes LUE Weight Bearing Per Provider Order: Non weight bearing     Mobility Bed Mobility      General bed mobility comments: pt was received seated in the chair    Transfers Overall transfer level: Needs assistance Equipment used: None Transfers: Sit to/from Stand Sit to Stand: Supervision                  Balance Overall balance assessment: No apparent balance deficits (not formally assessed)         ADL either performed or assessed with clinical judgement   ADL Overall ADL's : Needs assistance/impaired                 Upper Body Dressing : Moderate assistance;Sitting;Cueing for compensatory techniques Upper Body Dressing Details (indicate cue type and reason): Patient required assist to donn over head shirt seated in the chair. Lower Body Dressing: Moderate assistance;Sit to/from stand;Sitting/lateral leans;Cueing for compensatory techniques Lower Body Dressing Details (indicate cue type and reason): Patient required assist to donn pants, socks, and slide in shoes.               General ADL Comments: Per orders, shoulder parameters as follows  for ADL tasks: Abd 0-45; ER 0-20; FF 0-60. Okay to perform elbow/wrist/hand ROM. While moving within specified parameters, pt/caregiver instructed on bathing and how to donn/doff shirt by placing operative arm through the sleeve first when donning shirt and out last when doffing shirt.  Pt/caregiver educated on compensatory strategies for lower body ADLs and strategies to reduce risk of falls.  Pt/caregiver educated on donning/doffing sling, to wear the sling at all times with the exception of ADLs/exercise/for temporary break but in a controlled enviroment, and to loosen the neck strap of the sling when the operative arm is in a supported position when sitting.  Education regarding use of IceMan Cold Therapy completed, including the importance of using a barrier on the shoulder prior to positioning the wrap-on pad. Pt/caregiver verbalized/demonstrated understanding. Teach Back used while caregiver assisted with dressing pt.      Pertinent Vitals/Pain Pain Assessment Pain Assessment: No/denies pain     Extremity/Trunk Assessment Upper Extremity Assessment Upper Extremity Assessment: Right hand dominant   Lower Extremity Assessment Lower Extremity Assessment: Overall WFL for tasks assessed       Communication Communication Communication: No apparent difficulties   Cognition Arousal: Alert Behavior During Therapy: WFL for tasks assessed/performed Cognition: No apparent impairments             OT - Cognition Comments: Oriented x4                 Following commands: Intact       Cueing  General Comments   Cueing Techniques: Verbal cues         Shoulder Instructions Shoulder Instructions Donning/doffing shirt without moving shoulder: Moderate assistance Method for sponge bathing under operated UE: Patient able to independently direct caregiver Donning/doffing sling/immobilizer: Moderate assistance Correct positioning of sling/immobilizer: Moderate assistance Pendulum exercises (written home exercise program): Patient able to independently direct caregiver;Caregiver independent with task ROM for elbow, wrist and digits of operated UE: Caregiver independent with task;Patient able to independently direct caregiver Sling wearing schedule (on at all  times/off for ADL's): Caregiver independent with task;Patient able to independently direct caregiver Proper positioning of operated UE when showering: Caregiver independent with task;Patient able to independently direct caregiver Dressing change: Caregiver independent with task;Patient able to independently direct caregiver Positioning of UE while sleeping: Caregiver independent with task;Patient able to independently direct caregiver    Home Living Family/patient expects to be discharged to:: Private residence Living Arrangements: Spouse/significant other (Fiance' and young son) Available Help at Discharge: Family Type of Home: House Home Access: Stairs to enter Secretary/administrator of Steps: 3 Entrance Stairs-Rails: None Home Layout: One level     Bathroom Shower/Tub: Runner, Broadcasting/film/video: None          Prior Functioning/Environment Prior Level of Function : Driving;Working/employed;Independent/Modified Independent           OT Problem List: Decreased range of motion;Impaired UE functional use;Decreased strength        OT Goals(Current goals can be found in the care plan section)   Acute Rehab OT Goals OT Goal Formulation: All assessment and education complete, DC therapy         AM-PAC OT 6 Clicks Daily Activity     Outcome Measure Help from another person eating meals?: None Help from another person taking care of personal grooming?: None Help from another person toileting, which includes using toliet, bedpan, or urinal?: A Little Help from another person bathing (including washing, rinsing, drying)?:  A Little Help from another person to put on and taking off regular upper body clothing?: A Lot Help from another person to put on and taking off regular lower body clothing?: A Lot 6 Click Score: 18   End of Session Equipment Utilized During Treatment: Other (comment) (N/A) Nurse Communication: Other (comment) (shoulder education  completed)  Activity Tolerance: Patient tolerated treatment well Patient left: in CPM;with call bell/phone within reach;with family/visitor present  OT Visit Diagnosis: Muscle weakness (generalized) (M62.81)                Time: 8270-8247 OT Time Calculation (min): 23 min Charges:  OT General Charges $OT Visit: 1 Visit OT Evaluation $OT Eval Moderate Complexity: 1 Mod OT Treatments $Self Care/Home Management : 8-22 mins    Delanna JINNY Lesches, OTR/L 02/09/2024, 6:07 PM

## 2024-02-09 NOTE — Discharge Instructions (Signed)
 Franky HERO. Supple, M.D., F.A.A.O.S. Orthopaedic Surgery Specializing in Arthroscopic and Reconstructive Surgery of the Shoulder [phone] [address] [place], [place] [address] - Fax [phone]   POST-OP TOTAL SHOULDER REPLACEMENT INSTRUCTIONS  1. Follow up in the office for your first post-op appointment 10-14 days from the date of your surgery. If you do not already have a scheduled appointment, our office will contact you to schedule.  2. The bandage over your incision is waterproof. You may begin showering with this dressing on. You may leave this dressing on until first follow up appointment within 2 weeks. We prefer you leave this dressing in place until follow up however after 5-7 days if you are having itching or skin irritation and would like to remove it you may do so. Go slow and tug at the borders gently to break the bond the dressing has with the skin. At this point if there is no drainage it is okay to go without a bandage or you may cover it with a light guaze and tape. You can also expect significant bruising around your shoulder that will drift down your arm and into your chest wall. This is very normal and should resolve over several days.   3. Wear your sling/immobilizer at all times except to perform the exercises below or to occasionally let your arm dangle by your side to stretch your elbow. You also need to sleep in your sling immobilizer until instructed otherwise. It is ok to remove your sling if you are sitting in a controlled environment and allow your arm to rest in a position of comfort by your side or on your lap with pillows to give your neck and skin a break from the sling. You may remove it to allow arm to dangle by side to shower. If you are up walking around and when you go to sleep at night you need to wear it.  4. Range of motion to your elbow, wrist, and hand are encouraged 3-5 times daily. Exercise to your hand and fingers helps to reduce  swelling you may experience.   5. Prescriptions for a pain medication and a muscle relaxant are provided for you. It is recommended that if you are experiencing pain that you pain medication alone is not controlling, add the muscle relaxant along with the pain medication which can give additional pain relief. The first 1-2 days is generally the most severe of your pain and then should gradually decrease. As your pain lessens it is recommended that you decrease your use of the pain medications to an as needed basis' only and to always comply with the recommended dosages of the pain medications.  6. Pain medications can produce constipation along with their use. If you experience this, the use of an over the counter stool softener or laxative daily is recommended.   7. For additional questions or concerns, please do not hesitate to call the office. If after hours there is an answering service to forward your concerns to the physician or physician assistant on call.  8.Pain control following an exparel block  To help control your post-operative pain you received a nerve block  performed with Exparel which is a long acting anesthetic (numbing agent) which can provide pain relief and sensations of numbness (and relief of pain) in the operative shoulder and arm for up to 3 days. Sometimes it provides mixed relief, meaning you may still have numbness in certain areas of the arm but can  still be able to move  parts of that arm, hand, and fingers. We recommend that your prescribed pain medications  be used as needed. We do not feel it is necessary to pre medicate and stay ahead of pain.  Taking narcotic pain medications when you are not having any pain can lead to unnecessary and potentially dangerous side effects.    While the effects of the nerve block are present, please be aware that while you do not have sensation we recommend you pay careful attention to keep your arm positioned in a protective way.  Also if you have a sling that has a "thumb loop" that helps to keep the sling from sliding backwards, make sure you remove the loop to avoid a constant pressure to the thumb which can cause some nerve damage or skin breakdown.  9. Use the ice machine as much as possible in the first 5-7 days from surgery, then you can wean its use to as needed. The ice typically needs to be replaced every 6 hours, instead of ice you can actually freeze water  bottles to put in the cooler and then fill water  around them to avoid having to purchase ice. You can have spare water  bottles freezing to allow you to rotate them once they have melted. Try to have a thin shirt or light cloth or towel under the ice wrap to protect your skin.   FOR ADDITIONAL INFO ON ICE MACHINE AND INSTRUCTIONS GO TO THE WEBSITE AT  [email or web address]   10. Most people find it more comfortable to sleep in a semi upright position or in a recliner with the arm supported. Again, we do recommend you wear your sling to sleep. It is also ok to try to sleep lying flat in bed with a pillow behind your arm to keep it from sliding or falling backwards. If you are trying to sleep on the non-operative side, please use pillows to position your arm so that it does not slide forwards or backwards. It is very common that sleeping and getting into a comfortable position is difficult after shoulder surgery, this should improve with time.  11. Dressing - It is recommended you wear shirts that are loose or button up the front. To put shirt on allow operative arm to dangle by your side and slide the shirt on that arm, then your head and then the non-operative arm. To remove the shirt do this sequence in reverse. We recommend you wear the sling over top of your shirt to prevent skin irritation. If you notice irritation in your axilla (arm pit) please try to elevate arm away from your body to allow air to get in this area and  consider use of an over the counter ointment such as hydrocortisone or simple lotion.  12.  We recommend that you avoid any dental work or cleaning in the first 3 months following your joint replacement. This is to help minimize the possibility of infection from the bacteria in your mouth that enters your bloodstream during dental work. We also recommend that you take an antibiotic prior to your dental work for the first year after your shoulder replacement to further help reduce that risk. Please simply contact our office for antibiotics to be sent to your pharmacy prior to dental work.  13. Dental Antibiotics:  We recommend waiting at least 3 months for any dental work even cleanings unless there is a actuary. We also recommend  prophylactic antibiotics for all dental procdeures  the  first year following your joint replacement. In some exceptions we recommend them to be used lifelong. We will provide you with that prescription in follow up office visits, or you can call our office.   POST-OP EXERCISES  See attached sheet provided

## 2024-02-09 NOTE — H&P (Signed)
 Deborah Douglas    Chief Complaint: Left shoulder osteoarthritis HPI: The patient is a 33 y.o. female well-known to our practice with remote history of arthroscopic stabilization for the treatment of posterior instability, presents with chronic and progressively increasing left shoulder pain with progression to significant underlying osteoarthritis.  Additionally the patient does have evidence for posterior glenohumeral subluxation.  Due to her increasing pain and failure to respond to prolonged attempted conservative management she is brought to the operating this time for planned left shoulder hemiarthroplasty.  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   Irritable bowel syndrome    Migraine    Morbid obesity (HCC)    Polycystic ovarian syndrome    Urticaria    pt states this was when she had a reaction to an all natural hair product      Past Surgical History:  Procedure Laterality Date   CESAREAN SECTION  2023   CHOLECYSTECTOMY  2014   ESOPHAGOGASTRODUODENOSCOPY  05/26/2011   Procedure: ESOPHAGOGASTRODUODENOSCOPY (EGD);  Surgeon: Claudis RAYMOND Rivet, MD;  Location: [facility];  Service: Endoscopy;  Laterality: N/A;  300   KNEE ARTHROSCOPY Right 2010   left shoulder surgery Left 2015   TONSILLECTOMY AND ADENOIDECTOMY     <39 years old   WISDOM TOOTH EXTRACTION  09/2023   WRIST SURGERY Left 11/17/2023    Family History  Problem Relation Age of Onset   Thyroid  disease Mother    GER disease Mother    Irritable bowel syndrome Mother    Addison's disease Mother    Allergic rhinitis Mother    Sleep apnea Mother    High blood pressure Mother    High blood pressure Father    GER disease Father    Thyroid  disease Sister    Healthy Sister    Healthy  Sister    Allergic rhinitis Sister    Sleep apnea Sister    Allergic rhinitis Maternal Grandmother    Angioedema Neg Hx    Asthma Neg Hx    Eczema Neg Hx    Immunodeficiency Neg Hx    Urticaria Neg Hx     Social History:  reports that she has never smoked. She has never used smokeless tobacco. She reports that she does not currently use alcohol. She reports that she does not use drugs.  BMI: Estimated body mass index is 38.74 kg/m as calculated from the following:   Height as of 02/07/24: 5' 6 (1.676 m).   Weight as of 02/07/24: 108.9 kg.    Diabetes: Patient does not have a diagnosis of diabetes. Lab Results  Component Value Date   HGBA1C 5.4 03/11/2023     Smoking Status:   reports that she has never smoked. She has never used smokeless tobacco.     No medications prior to admission.     Physical Exam: Left shoulder demonstrates functional motion but with significant pain throughout her range.  Maintains good strength to manual muscle testing.  Otherwise neurovascular intact in the left upper extremity with examination as noted at her recent office visits.  Imaging studies confirm advanced glenohumeral arthritis.  MRI scan confirms rotator cuff to be intact.  Moderate posterior subluxation noted.  Vitals  Assessment/Plan  Impression: Left shoulder osteoarthritis  Plan of Action: Procedure(s): HEMIARTHROPLASTY, SHOULDER  Rui Wordell M Erubiel Manasco 02/09/2024, 6:27 AM Contact # [phone]

## 2024-02-10 ENCOUNTER — Encounter (HOSPITAL_COMMUNITY): Payer: Self-pay | Admitting: Orthopedic Surgery

## 2024-02-10 NOTE — Anesthesia Postprocedure Evaluation (Signed)
 Anesthesia Post Note  Patient: Deborah Douglas  Procedure(s) Performed: HEMIARTHROPLASTY, SHOULDER (Left: Shoulder)     Patient location during evaluation: PACU Anesthesia Type: General Level of consciousness: awake and alert Pain management: pain level controlled Vital Signs Assessment: post-procedure vital signs reviewed and stable Respiratory status: spontaneous breathing, nonlabored ventilation and respiratory function stable Cardiovascular status: stable and blood pressure returned to baseline Anesthetic complications: no   No notable events documented.  Last Vitals:  Vitals:   02/09/24 1715 02/09/24 1730  BP: 137/88 (!) 113/95  Pulse: 89 89  Resp:    Temp:    SpO2: 93% 95%    Last Pain:  Vitals:   02/09/24 1700  TempSrc:   PainSc: 4                  [name] Like

## 2024-02-29 ENCOUNTER — Ambulatory Visit: Payer: Self-pay

## 2024-02-29 NOTE — Telephone Encounter (Signed)
 FYI Only or Action Required?: FYI only for provider: appointment scheduled on 12/11.  Patient was last seen in primary care on 12/29/2023 by Tobie Suzzane POUR, MD.  Called Nurse Triage reporting Dysuria.  Symptoms began several days ago.  Interventions attempted: Rest, hydration, or home remedies and Ice/heat application.  Symptoms are: gradually worsening.  Triage Disposition: See Physician Within 24 Hours  Patient/caregiver understands and will follow disposition?: Yes  Copied from CRM #[id number]. Topic: Clinical - Red Word Triage >> Feb 29, 2024  1:05 PM Shardie S wrote: [facility] that prompted transfer to Nurse Triage: burning with urination Reason for Disposition  [1] MILD to MODERATE vaginal pain AND [2] present > 24 hours  (Exception: Chronic pain.)  Answer Assessment - Initial Assessment Questions Few days of painful urination. Has scratch below her urethra that is painful when she voids and when she is up moving around. Red and slighly swollen.  Denies urethra pain when voiding, or any UTI symptoms. Denies fever, abdominal pain, SOB. Appt with alternative office 12/11 to assess. Advised to keep area clean with peribottle, vaseline to protect it or tucks pads to have the witch hazel calm the swelling.   1. SYMPTOM: What's the main symptom you're concerned about? (e.g., pain, itching, dryness)     pain 2. LOCATION: Where is the  pain  located? (e.g., inside/outside, left/right)     In the middle of below her urethra 3. ONSET: When did the  pain start  start?     Few days ago  4. PAIN: Is there any pain? If Yes, ask: How bad is it? (Scale: 1-10; mild, moderate, severe)     6-7/10 5. ITCHING: Is there any itching? If Yes, ask: How bad is it? (Scale: 1-10; mild, moderate, severe)     Denies  6. CAUSE: What do you think is causing the discharge? Have you had the same problem before? What happened then?     Scratch to the area 7. OTHER SYMPTOMS: Do you have any  other symptoms? (e.g., fever, itching, vaginal bleeding, pain with urination, injury to genital area, vaginal foreign body)     redness 8. PREGNANCY: Is there any chance you are pregnant? When was your last menstrual period?     denies  Protocols used: Vaginal Symptoms-A-AH

## 2024-02-29 NOTE — Telephone Encounter (Signed)
Noted patient scheduled

## 2024-03-01 ENCOUNTER — Encounter: Payer: Self-pay | Admitting: Family Medicine

## 2024-03-01 ENCOUNTER — Ambulatory Visit: Admitting: Family Medicine

## 2024-03-01 VITALS — BP 100/70 | HR 110 | Ht 66.0 in | Wt 244.8 lb

## 2024-03-01 DIAGNOSIS — Z113 Encounter for screening for infections with a predominantly sexual mode of transmission: Secondary | ICD-10-CM

## 2024-03-01 DIAGNOSIS — N766 Ulceration of vulva: Secondary | ICD-10-CM | POA: Insufficient documentation

## 2024-03-01 DIAGNOSIS — Z20828 Contact with and (suspected) exposure to other viral communicable diseases: Secondary | ICD-10-CM

## 2024-03-01 LAB — URINALYSIS, ROUTINE W REFLEX MICROSCOPIC
Bilirubin Urine: NEGATIVE
Glucose, UA: NEGATIVE
Hyaline Cast: NONE SEEN /LPF
Ketones, ur: NEGATIVE
Nitrite: NEGATIVE
Protein, ur: NEGATIVE
Specific Gravity, Urine: 1.015 (ref 1.001–1.035)
pH: 7 (ref 5.0–8.0)

## 2024-03-01 LAB — TIQ- AMBIGUOUS ORDER

## 2024-03-01 LAB — MICROSCOPIC MESSAGE

## 2024-03-01 MED ORDER — VALACYCLOVIR HCL 1 G PO TABS: 1000.0000 mg | ORAL_TABLET | Freq: Two times a day (BID) | ORAL | 0 refills | Status: AC

## 2024-03-01 NOTE — Progress Notes (Signed)
 Acute Office Visit  Patient ID: Deborah Douglas, female    DOB: 04-Jun-1990, 33 y.o.   MRN: [id number]  PCP: Terry Wilhelmena Lloyd Hilario, FNP  Chief Complaint  Patient presents with   Acute Visit    UA  Labial scratch  burning , redness and tenderness      Subjective:     HPI  Discussed the use of AI scribe software for clinical note transcription with the patient, who gave verbal consent to proceed.  History of Present Illness Deborah Douglas is a 33 year old female who presents with a painful ulceration in the genital area.  She noticed the onset of pain in the genital area approximately two to three days ago, followed by a burning sensation. She reported seeing a little white spot and redness in the affected area.  There is no history of similar symptoms. No abnormal discharge, abnormal bleeding, or hematuria. She experiences dysuria but no fever, chills, myalgia, back pain, or abdominal pain.  She is sexually active with a consistent partner but has not engaged in sexual activity since her surgery on November 20th, approximately three weeks ago. She is unsure if a catheter was used during her surgery and has not applied any topical treatments to the affected area. She denies the use of any sexual toys. Her partner does have a history of HSV.   Review of Systems  All other systems reviewed and are negative.   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   Irritable bowel syndrome    Migraine    Morbid obesity (HCC)    Polycystic ovarian syndrome    Urticaria    pt states this was when she had a reaction to an all natural hair product    Past Surgical History:  Procedure Laterality Date   CESAREAN SECTION  2023   CHOLECYSTECTOMY  2014    ESOPHAGOGASTRODUODENOSCOPY  05/26/2011   Procedure: ESOPHAGOGASTRODUODENOSCOPY (EGD);  Surgeon: Claudis RAYMOND Rivet, MD;  Location: [facility];  Service: Endoscopy;  Laterality: N/A;  300   KNEE ARTHROSCOPY Right 2010   left shoulder surgery Left 2015   SHOULDER HEMI-ARTHROPLASTY Left 02/09/2024   Procedure: HEMIARTHROPLASTY, SHOULDER;  Surgeon: Melita Drivers, MD;  Location: [facility];  Service: Orthopedics;  Laterality: Left;   TONSILLECTOMY AND ADENOIDECTOMY     <79 years old   WISDOM TOOTH EXTRACTION  09/2023   WRIST SURGERY Left 11/17/2023    Medications Ordered Prior to Encounter[1]  Allergies[2]     Objective:    BP 100/70   Pulse (!) 110   Ht 5' 6 (1.676 m)   Wt 244 lb 12.8 oz (111 kg)   SpO2 99%   BMI 39.51 kg/m    Physical Exam Vitals and nursing note reviewed. Chaperone present: chaperone declined.  Constitutional:      Appearance: Normal appearance. She is normal weight.  HENT:     Head: Normocephalic and atraumatic.  Genitourinary:    Labia:        Right: Tenderness and lesion present.       Comments: 0.5-1cm erosive ulcerations with surrounding erythema Skin:    General: Skin is warm and dry.  Neurological:     General: No  focal deficit present.     Mental Status: She is alert and oriented to person, place, and time. Mental status is at baseline.  Psychiatric:        Mood and Affect: Mood normal.        Behavior: Behavior normal.        Thought Content: Thought content normal.        Judgment: Judgment normal.       No results found for any visits on 03/01/24.     Assessment & Plan:   Problem List Items Addressed This Visit       Genitourinary   Genital ulcer, female   Relevant Orders   Urinalysis, Routine w reflex microscopic   C. trachomatis/N. gonorrhoeae RNA   Herpes simplex virus culture   HIV Antibody (routine testing w rflx)   RPR W/RFLX TO RPR TITER, TREPONEMAL AB, SCREEN AND DIAGNOSIS   HSV 1/2 Ab (IgM), IFA w/rflx Titer    Other Visit Diagnoses       Routine screening for STI (sexually transmitted infection)    -  Primary   Relevant Orders   Urinalysis, Routine w reflex microscopic   C. trachomatis/N. gonorrhoeae RNA   Herpes simplex virus culture   HIV Antibody (routine testing w rflx)   RPR W/RFLX TO RPR TITER, TREPONEMAL AB, SCREEN AND DIAGNOSIS   HSV 1/2 Ab (IgM), IFA w/rflx Titer     Exposure to herpes simplex virus (HSV)       Relevant Orders   Urinalysis, Routine w reflex microscopic   C. trachomatis/N. gonorrhoeae RNA   Herpes simplex virus culture   HIV Antibody (routine testing w rflx)   RPR W/RFLX TO RPR TITER, TREPONEMAL AB, SCREEN AND DIAGNOSIS   HSV 1/2 Ab (IgM), IFA w/rflx Titer       Assessment and Plan Assessment & Plan Vulvar ulceration Acute vulvar ulceration with burning and dysuria. Swabbed ulceration for HSV testing. - Tested urine for gonorrhea and chlamydia. - Full STI testing performed. - Advised on transmission and safe sex practices.  - Start Valacyclovir 1000mg  BID x10 days. - Follow up if symptoms persist or worsen.    Meds ordered this encounter  Medications   valACYclovir (VALTREX) 1000 MG tablet    Sig: Take 1 tablet (1,000 mg total) by mouth 2 (two) times daily for 10 days.    Dispense:  20 tablet    Refill:  0    Supervising Provider:   DUANNE LOWERS T [[id number]]    Return if symptoms worsen or fail to improve.  Jeoffrey GORMAN Barrio, FNP [facility]       [1]  Current Outpatient Medications on File Prior to Visit  Medication Sig Dispense Refill   acetaminophen  (TYLENOL ) 500 MG tablet Take 500-1,000 mg by mouth every 6 (six) hours as needed (pain.).     celecoxib  (CELEBREX ) 200 MG capsule Take 1 capsule (200 mg total) by mouth daily. 30 capsule 2   cyclobenzaprine  (FLEXERIL ) 10 MG tablet Take 1 tablet (10 mg total) by mouth 3 (three) times daily as needed for muscle spasms. 30 tablet 1   escitalopram (LEXAPRO) 20 MG  tablet Take 20 mg by mouth every evening.     levothyroxine  (SYNTHROID ) 50 MCG tablet Take 50 mcg by mouth at bedtime.     Omega-3 Fatty Acids (FISH OIL  PO) Take 1 capsule by mouth at bedtime.     ondansetron  (ZOFRAN ) 4 MG tablet Take 1 tablet (4 mg total)  by mouth every 8 (eight) hours as needed for nausea or vomiting. 10 tablet 0   oxyCODONE -acetaminophen  (PERCOCET) 5-325 MG tablet Take 1 tablet by mouth every 4 (four) hours as needed (max 6 q). 20 tablet 0   Prenatal Vit-Fe Fumarate-FA (PRENATAL PO) Take 1 tablet by mouth at bedtime.     spironolactone (ALDACTONE) 50 MG tablet Take 50 mg by mouth every evening.     tirzepatide  (ZEPBOUND ) 15 MG/0.5ML Pen Inject 15 mg into the skin once a week. (Patient taking differently: Inject 15 mg into the skin every Monday.) 2 mL 5   No current facility-administered medications on file prior to visit.  [2]  Allergies Allergen Reactions   Isometheptene-Dichloral-Apap     Numbness: MIDRIN

## 2024-03-02 LAB — C. TRACHOMATIS/N. GONORRHOEAE RNA
C. trachomatis RNA, TMA: NOT DETECTED
N. gonorrhoeae RNA, TMA: NOT DETECTED

## 2024-03-06 ENCOUNTER — Encounter: Payer: Self-pay | Admitting: Family Medicine

## 2024-03-06 LAB — HERPES SIMPLEX VIRUS CULTURE
MICRO NUMBER:: 17343718
SPECIMEN QUALITY:: ADEQUATE

## 2024-03-07 ENCOUNTER — Encounter: Payer: Self-pay | Admitting: Family Medicine

## 2024-03-09 LAB — HIV ANTIBODY (ROUTINE TESTING W REFLEX)
HIV 1&2 Ab, 4th Generation: NONREACTIVE
HIV FINAL INTERPRETATION: NEGATIVE

## 2024-03-09 LAB — TEST AUTHORIZATION

## 2024-03-09 LAB — SYPHILIS: RPR W/REFLEX TO RPR TITER AND TREPONEMAL ANTIBODIES, TRADITIONAL SCREENING AND DIAGNOSIS ALGORITHM: RPR Ser Ql: NONREACTIVE

## 2024-03-09 LAB — HERPES SIMPLEX VIRUS 1 AND 2 (IGG),REFLEX HSV-2 INHIBITION
HSV 1 IGG,TYPE SPECIFIC AB: <0.9 {index}
HSV 2 IGG,TYPE SPECIFIC AB: <0.9 {index}

## 2024-03-12 ENCOUNTER — Ambulatory Visit: Payer: Self-pay | Admitting: Family Medicine

## 2024-03-19 ENCOUNTER — Ambulatory Visit: Admitting: Family Medicine

## 2024-04-03 ENCOUNTER — Encounter (HOSPITAL_COMMUNITY): Payer: Self-pay

## 2024-04-03 ENCOUNTER — Other Ambulatory Visit (HOSPITAL_COMMUNITY): Payer: Self-pay

## 2024-04-05 ENCOUNTER — Other Ambulatory Visit: Payer: Self-pay

## 2024-04-05 ENCOUNTER — Other Ambulatory Visit (HOSPITAL_COMMUNITY): Payer: Self-pay

## 2024-05-01 ENCOUNTER — Ambulatory Visit: Admitting: Internal Medicine

## 2024-11-12 ENCOUNTER — Ambulatory Visit: Admitting: Family Medicine
